# Patient Record
Sex: Male | Born: 1955 | Race: White | Hispanic: No | Marital: Married | Smoking: Never smoker
Health system: Southern US, Community
[De-identification: ages and names within clinical notes are randomized; demographics above are authoritative.]

## PROBLEM LIST (undated history)

## (undated) DIAGNOSIS — E785 Hyperlipidemia, unspecified: Secondary | ICD-10-CM

## (undated) DIAGNOSIS — Z8711 Personal history of peptic ulcer disease: Secondary | ICD-10-CM

## (undated) DIAGNOSIS — E119 Type 2 diabetes mellitus without complications: Secondary | ICD-10-CM

## (undated) DIAGNOSIS — Z6827 Body mass index (BMI) 27.0-27.9, adult: Secondary | ICD-10-CM

## (undated) DIAGNOSIS — K284 Chronic or unspecified gastrojejunal ulcer with hemorrhage: Secondary | ICD-10-CM

## (undated) DIAGNOSIS — H9190 Unspecified hearing loss, unspecified ear: Secondary | ICD-10-CM

## (undated) DIAGNOSIS — Z87442 Personal history of urinary calculi: Secondary | ICD-10-CM

## (undated) DIAGNOSIS — N183 Chronic kidney disease, stage 3 unspecified: Secondary | ICD-10-CM

## (undated) DIAGNOSIS — N200 Calculus of kidney: Secondary | ICD-10-CM

## (undated) DIAGNOSIS — E1122 Type 2 diabetes mellitus with diabetic chronic kidney disease: Secondary | ICD-10-CM

## (undated) DIAGNOSIS — C4490 Unspecified malignant neoplasm of skin, unspecified: Secondary | ICD-10-CM

## (undated) DIAGNOSIS — I129 Hypertensive chronic kidney disease with stage 1 through stage 4 chronic kidney disease, or unspecified chronic kidney disease: Secondary | ICD-10-CM

## (undated) DIAGNOSIS — K259 Gastric ulcer, unspecified as acute or chronic, without hemorrhage or perforation: Secondary | ICD-10-CM

## (undated) DIAGNOSIS — K274 Chronic or unspecified peptic ulcer, site unspecified, with hemorrhage: Secondary | ICD-10-CM

## (undated) DIAGNOSIS — I10 Essential (primary) hypertension: Secondary | ICD-10-CM

## (undated) HISTORY — DX: Chronic or unspecified gastrojejunal ulcer with hemorrhage: K28.4

## (undated) HISTORY — DX: Essential (primary) hypertension: I10

## (undated) HISTORY — PX: EYE SURGERY: SHX253

## (undated) HISTORY — DX: Hyperlipidemia, unspecified: E78.5

## (undated) HISTORY — DX: Type 2 diabetes mellitus with diabetic chronic kidney disease: E11.22

## (undated) HISTORY — DX: Type 2 diabetes mellitus without complications: E11.9

## (undated) HISTORY — DX: Unspecified malignant neoplasm of skin, unspecified: C44.90

## (undated) HISTORY — PX: OTHER SURGICAL HISTORY: SHX169

## (undated) HISTORY — DX: Unspecified hearing loss, unspecified ear: H91.90

## (undated) HISTORY — DX: Chronic kidney disease, stage 3 unspecified: N18.30

## (undated) HISTORY — DX: Calculus of kidney: N20.0

## (undated) HISTORY — DX: Chronic or unspecified peptic ulcer, site unspecified, with hemorrhage: K27.4

## (undated) HISTORY — DX: Gastric ulcer, unspecified as acute or chronic, without hemorrhage or perforation: K25.9

## (undated) HISTORY — DX: Body mass index (BMI) 27.0-27.9, adult: Z68.27

## (undated) HISTORY — DX: Type 2 diabetes mellitus with diabetic chronic kidney disease: I12.9

---
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
## 2005-04-12 ENCOUNTER — Ambulatory Visit: Admission: RE | Admit: 2005-04-12 | Discharge: 2005-04-12 | Payer: Self-pay | Admitting: Ophthalmology

## 2005-05-18 ENCOUNTER — Ambulatory Visit (HOSPITAL_COMMUNITY): Admission: RE | Admit: 2005-05-18 | Discharge: 2005-05-18 | Payer: Self-pay | Admitting: Ophthalmology

## 2006-08-02 HISTORY — PX: CATARACT EXTRACTION: SUR2

## 2015-02-19 HISTORY — PX: COLONOSCOPY: SHX174

## 2015-04-22 DIAGNOSIS — R5382 Chronic fatigue, unspecified: Secondary | ICD-10-CM | POA: Insufficient documentation

## 2015-04-22 DIAGNOSIS — R0602 Shortness of breath: Secondary | ICD-10-CM | POA: Insufficient documentation

## 2015-04-22 DIAGNOSIS — I10 Essential (primary) hypertension: Secondary | ICD-10-CM

## 2015-04-22 DIAGNOSIS — E119 Type 2 diabetes mellitus without complications: Secondary | ICD-10-CM | POA: Insufficient documentation

## 2015-04-22 HISTORY — DX: Shortness of breath: R06.02

## 2015-04-22 HISTORY — DX: Type 2 diabetes mellitus without complications: E11.9

## 2015-04-22 HISTORY — DX: Chronic fatigue, unspecified: R53.82

## 2015-04-22 HISTORY — DX: Essential (primary) hypertension: I10

## 2015-04-30 HISTORY — PX: ESOPHAGOGASTRODUODENOSCOPY: SHX1529

## 2016-08-02 HISTORY — PX: CATARACT EXTRACTION: SUR2

## 2018-10-09 DIAGNOSIS — D696 Thrombocytopenia, unspecified: Secondary | ICD-10-CM | POA: Diagnosis not present

## 2019-07-20 ENCOUNTER — Other Ambulatory Visit: Payer: Self-pay

## 2019-07-20 ENCOUNTER — Ambulatory Visit (INDEPENDENT_AMBULATORY_CARE_PROVIDER_SITE_OTHER): Payer: Medicare Other | Admitting: Cardiology

## 2019-07-20 ENCOUNTER — Encounter: Payer: Self-pay | Admitting: Cardiology

## 2019-07-20 DIAGNOSIS — R9431 Abnormal electrocardiogram [ECG] [EKG]: Secondary | ICD-10-CM

## 2019-07-20 DIAGNOSIS — E782 Mixed hyperlipidemia: Secondary | ICD-10-CM

## 2019-07-20 DIAGNOSIS — E088 Diabetes mellitus due to underlying condition with unspecified complications: Secondary | ICD-10-CM

## 2019-07-20 HISTORY — DX: Abnormal electrocardiogram (ECG) (EKG): R94.31

## 2019-07-20 HISTORY — DX: Mixed hyperlipidemia: E78.2

## 2019-07-20 HISTORY — DX: Diabetes mellitus due to underlying condition with unspecified complications: E08.8

## 2019-07-20 NOTE — Patient Instructions (Signed)
Medication Instructions:  Your physician recommends that you continue on your current medications as directed. Please refer to the Current Medication list given to you today.  *If you need a refill on your cardiac medications before your next appointment, please call your pharmacy*  Lab Work: None Ordered   Testing/Procedures: Your physician has requested that you have cardiac CT. Cardiac computed tomography (CT) is a painless test that uses an x-ray machine to take clear, detailed pictures of your heart. For further information please visit [email or web address]. Please follow instruction sheet as given.    Follow-Up: At [facility], you and your health needs are our priority.  As part of our continuing mission to provide you with exceptional heart care, we have created designated Provider Care Teams.  These Care Teams include your primary Cardiologist (physician) and Advanced Practice Providers (APPs -  Physician Assistants and Nurse Practitioners) who all work together to provide you with the care you need, when you need it.  Your next appointment:   2 month(s)  The format for your next appointment:   In Person  Provider:   Jyl Heinz, MD  Other Instructions  Echocardiogram An echocardiogram is a procedure that uses painless sound waves (ultrasound) to produce an image of the heart. Images from an echocardiogram can provide important information about:  Signs of coronary artery disease (CAD).  Aneurysm detection. An aneurysm is a weak or damaged part of an artery wall that bulges out from the normal force of blood pumping through the body.  Heart size and shape. Changes in the size or shape of the heart can be associated with certain conditions, including heart failure, aneurysm, and CAD.  Heart muscle function.  Heart valve function.  Signs of a past heart attack.  Fluid buildup around the heart.  Thickening of the heart muscle.  A tumor or infectious growth  around the heart valves. Tell a health care provider about:  Any allergies you have.  All medicines you are taking, including vitamins, herbs, eye drops, creams, and over-the-counter medicines.  Any blood disorders you have.  Any surgeries you have had.  Any medical conditions you have.  Whether you are pregnant or may be pregnant. What are the risks? Generally, this is a safe procedure. However, problems may occur, including:  Allergic reaction to dye (contrast) that may be used during the procedure. What happens before the procedure? No specific preparation is needed. You may eat and drink normally. What happens during the procedure?   An IV tube may be inserted into one of your veins.  You may receive contrast through this tube. A contrast is an injection that improves the quality of the pictures from your heart.  A gel will be applied to your chest.  A wand-like tool (transducer) will be moved over your chest. The gel will help to transmit the sound waves from the transducer.  The sound waves will harmlessly bounce off of your heart to allow the heart images to be captured in real-time motion. The images will be recorded on a computer. The procedure may vary among health care providers and hospitals. What happens after the procedure?  You may return to your normal, everyday life, including diet, activities, and medicines, unless your health care provider tells you not to do that. Summary  An echocardiogram is a procedure that uses painless sound waves (ultrasound) to produce an image of the heart.  Images from an echocardiogram can provide important information about the size and shape of your  heart, heart muscle function, heart valve function, and fluid buildup around your heart.  You do not need to do anything to prepare before this procedure. You may eat and drink normally.  After the echocardiogram is completed, you may return to your normal, everyday life, unless  your health care provider tells you not to do that. This information is not intended to replace advice given to you by your health care provider. Make sure you discuss any questions you have with your health care provider. Document Released: 07/16/2000 Document Revised: 11/09/2018 Document Reviewed: 08/21/2016 Elsevier Patient Education  [employer].   Coronary Calcium Scan A coronary calcium scan is an imaging test used to look for deposits of calcium and other fatty materials (plaques) in the inner lining of the blood vessels of the heart (coronary arteries). These deposits of calcium and plaques can partly clog and narrow the coronary arteries without producing any symptoms or warning signs. This puts a person at risk for a heart attack. This test can detect these deposits before symptoms develop. Tell a health care provider about:  Any allergies you have.  All medicines you are taking, including vitamins, herbs, eye drops, creams, and over-the-counter medicines.  Any problems you or family members have had with anesthetic medicines.  Any blood disorders you have.  Any surgeries you have had.  Any medical conditions you have.  Whether you are pregnant or may be pregnant. What are the risks? Generally, this is a safe procedure. However, problems may occur, including:  Harm to a pregnant woman and her unborn baby. This test involves the use of radiation. Radiation exposure can be dangerous to a pregnant woman and her unborn baby. If you are pregnant, you generally should not have this procedure done.  Slight increase in the risk of cancer. This is because of the radiation involved in the test. What happens before the procedure? No preparation is needed for this procedure. What happens during the procedure?   You will undress and remove any jewelry around your neck or chest.  You will put on a hospital gown.  Sticky electrodes will be placed on your chest. The electrodes  will be connected to an electrocardiogram (ECG) machine to record a tracing of the electrical activity of your heart.  A CT scanner will take pictures of your heart. During this time, you will be asked to lie still and hold your breath for 2-3 seconds while a picture of your heart is being taken. The procedure may vary among health care providers and hospitals. What happens after the procedure?  You can get dressed.  You can return to your normal activities.  It is up to you to get the results of your test. Ask your health care provider, or the department that is doing the test, when your results will be ready. Summary  A coronary calcium scan is an imaging test used to look for deposits of calcium and other fatty materials (plaques) in the inner lining of the blood vessels of the heart (coronary arteries).  Generally, this is a safe procedure. Tell your health care provider if you are pregnant or may be pregnant.  No preparation is needed for this procedure.  A CT scanner will take pictures of your heart.  You can return to your normal activities after the scan is done. This information is not intended to replace advice given to you by your health care provider. Make sure you discuss any questions you have with your health  care provider. Document Released: 01/15/2008 Document Revised: 07/01/2017 Document Reviewed: 06/07/2016 Elsevier Patient Education  2020 [employer].

## 2019-07-20 NOTE — Progress Notes (Signed)
Cardiology Office Note:    Date:  07/20/2019   ID:  MARTINJR Wu, DOB Dec 10, 1955, MRN [id number]  PCP:  Renaldo Reel, PA  Cardiologist:  Jenean Lindau, MD   Referring MD: Renaldo Reel, PA    ASSESSMENT:    1. Nonspecific abnormal electrocardiogram (ECG) (EKG)   2. Mixed dyslipidemia   3. Diabetes mellitus due to underlying condition with unspecified complications (David Wu)    PLAN:    In order of problems listed above:  1. Primary prevention stressed with the patient.  Importance of compliance with diet and medication stressed and he vocalized understanding.  Importance of regular exercise stressed walking at least 30 minutes a day 5 days a week.  He promises to do so.  Weight reduction was stressed. 2. Essential hypertension: Blood pressure stable.  Echocardiogram will be done to assess murmur heard on auscultation. 3. Mixed dyslipidemia: Diet was discussed patient is on statin therapy. 4. Diabetes mellitus: Diet was discussed.  He understands and promises to comply and do better. 5. For his stratification I discussed calcium CT scoring and is agreeable. 6. Patient will be seen in follow-up appointment in 2 months or earlier if the patient has any concerns    Medication Adjustments/Labs and Tests Ordered: Current medicines are reviewed at length with the patient today.  Concerns regarding medicines are outlined above.  No orders of the defined types were placed in this encounter.  No orders of the defined types were placed in this encounter.    History of Present Illness:    David Wu is a 63 y.o. male who is being seen today for the evaluation of abnormal EKG at the request of Renaldo Reel, [place].  Patient is a pleasant 63 year old male.  He has past medical history of essential hypertension, dyslipidemia and diabetes mellitus.  He is an active gentleman.  He walks some on a regular basis but does not exercise as prescribed.  He denies any chest pain  orthopnea or PND.  He is referred here for abnormal EKG.  At the time of my evaluation, the patient is alert awake oriented and in no distress.  Past Medical History:  Diagnosis Date  . Bleeding ulcer   . Diabetes ([place])   . Hyperlipidemia   . Hypertension   . Kidney stones     Past Surgical History:  Procedure Laterality Date  . EYE SURGERY Left     Current Medications: Current Meds  Medication Sig  . amLODipine-benazepril (LOTREL) 10-40 MG capsule Take by mouth. 1 tab daily  . atorvastatin (LIPITOR) 40 MG tablet Take by mouth. Take 1 tab daily  . cloNIDine (CATAPRES) 0.1 MG tablet Take by mouth. 1 tab twice daily  . Continuous Blood Gluc Sensor (FREESTYLE LIBRE 14 DAY SENSOR) MISC See admin instructions.  . Cyanocobalamin 2000 MCG TBCR Take by mouth.  . ergocalciferol (VITAMIN D2) 1.25 MG (50000 UT) capsule Take by mouth. weekly  . glimepiride (AMARYL) 2 MG tablet Take 2 mg by mouth 2 (two) times daily.  . hydrochlorothiazide (HYDRODIURIL) 25 MG tablet Take by mouth. Take 1 tab daily  . labetalol (NORMODYNE) 200 MG tablet Take by mouth. Take 1 tab twice daily  . Magnesium 200 MG TABS Take by mouth. 100 mg daily  . metFORMIN (GLUCOPHAGE) 500 MG tablet Take by mouth. Take 1 tab twice dialy  . omeprazole (PRILOSEC) 40 MG capsule Take by mouth. Take 1 tab daily  . pioglitazone (ACTOS) 45 MG tablet Take  45 mg by mouth daily.  . potassium chloride SA (KLOR-CON) 20 MEQ tablet Take by mouth. Take 1 tab daily  . tamsulosin (FLOMAX) 0.4 MG CAPS capsule Take by mouth. Take 1 tab daily     Allergies:   Patient has no allergy information on record.   Social History   Socioeconomic History  . Marital status: Single    Spouse name: Not on file  . Number of children: Not on file  . Years of education: Not on file  . Highest education level: Not on file  Occupational History  . Not on file  Tobacco Use  . Smoking status: Never Smoker  . Smokeless tobacco: Never Used  Substance and  Sexual Activity  . Alcohol use: Not Currently  . Drug use: Never  . Sexual activity: Not on file  Other Topics Concern  . Not on file  Social History Narrative  . Not on file   Social Determinants of Health   Financial Resource Strain:   . Difficulty of Paying Living Expenses: Not on file  Food Insecurity:   . Worried About Charity fundraiser in the Last Year: Not on file  . Ran Out of Food in the Last Year: Not on file  Transportation Needs:   . Lack of Transportation (Medical): Not on file  . Lack of Transportation (Non-Medical): Not on file  Physical Activity:   . Days of Exercise per Week: Not on file  . Minutes of Exercise per Session: Not on file  Stress:   . Feeling of Stress : Not on file  Social Connections:   . Frequency of Communication with Friends and Family: Not on file  . Frequency of Social Gatherings with Friends and Family: Not on file  . Attends Religious Services: Not on file  . Active Member of Clubs or Organizations: Not on file  . Attends Archivist Meetings: Not on file  . Marital Status: Not on file     Family History: The patient's family history includes Alzheimer's disease in his mother; Celiac disease in his brother; Diabetes in his father; Hypertension in his father; Stroke in his father.  ROS:   Please see the history of present illness.    All other systems reviewed and are negative.  EKGs/Labs/Other Studies Reviewed:    The following studies were reviewed today: EKG reveals sinus rhythm with nonspecific ST-T changes.  I do not have records available from primary care physician and will try to get a copy.   Recent Labs: No results found for requested labs within last 8760 hours.  Recent Lipid Panel No results found for: CHOL, TRIG, HDL, CHOLHDL, VLDL, LDLCALC, LDLDIRECT  Physical Exam:    VS:  BP (!) 160/72   Pulse 69   Ht 6' (1.829 m)   Wt 216 lb 3.2 oz (98.1 kg)   BMI 29.32 kg/m     Wt Readings from Last 3  Encounters:  07/20/19 216 lb 3.2 oz (98.1 kg)     GEN: Patient is in no acute distress HEENT: Normal NECK: No JVD; No carotid bruits LYMPHATICS: No lymphadenopathy CARDIAC: S1 S2 regular, 2/6 systolic murmur at the apex. RESPIRATORY:  Clear to auscultation without rales, wheezing or rhonchi  ABDOMEN: Soft, non-tender, non-distended MUSCULOSKELETAL:  No edema; No deformity  SKIN: Warm and dry NEUROLOGIC:  Alert and oriented x 3 PSYCHIATRIC:  Normal affect    Signed, Jenean Lindau, MD  07/20/2019 4:52 PM    [facility] [facility]

## 2019-07-20 NOTE — Progress Notes (Signed)
kg

## 2019-08-15 ENCOUNTER — Ambulatory Visit (INDEPENDENT_AMBULATORY_CARE_PROVIDER_SITE_OTHER)
Admission: RE | Admit: 2019-08-15 | Discharge: 2019-08-15 | Disposition: A | Discharge status: Home / Self Care | Payer: Self-pay | Source: Ambulatory Visit | Attending: Cardiology | Admitting: Cardiology

## 2019-08-15 ENCOUNTER — Other Ambulatory Visit: Payer: Self-pay

## 2019-08-15 DIAGNOSIS — R9431 Abnormal electrocardiogram [ECG] [EKG]: Secondary | ICD-10-CM

## 2019-08-20 ENCOUNTER — Telehealth: Payer: Self-pay

## 2019-08-20 NOTE — Telephone Encounter (Signed)
Patient is currently taking atorvastatin 40 mg (1 tablet) once daily as directed. Most recent labs requested from Dr. Lorin Mercy to verify lipid/hepatic results.

## 2019-08-20 NOTE — Telephone Encounter (Signed)
Reviewed. Keep meds same.no changes

## 2019-08-20 NOTE — Telephone Encounter (Signed)
-----   Message from Jenean Lindau, MD sent at 08/15/2019 12:18 PM EST ----- Calcium score is very elevated.  Please patient needs to come in for liver lipid check and initiate on statin therapy.  Cc primary care. Jenean Lindau, MD 08/15/2019 12:17 PM

## 2019-09-03 ENCOUNTER — Ambulatory Visit (INDEPENDENT_AMBULATORY_CARE_PROVIDER_SITE_OTHER): Payer: Medicare Other

## 2019-09-03 ENCOUNTER — Other Ambulatory Visit: Payer: Self-pay

## 2019-09-03 DIAGNOSIS — E782 Mixed hyperlipidemia: Secondary | ICD-10-CM

## 2019-09-03 DIAGNOSIS — R9431 Abnormal electrocardiogram [ECG] [EKG]: Secondary | ICD-10-CM

## 2019-09-03 DIAGNOSIS — E088 Diabetes mellitus due to underlying condition with unspecified complications: Secondary | ICD-10-CM

## 2019-09-03 NOTE — Progress Notes (Signed)
Complete echocardiogram has been performed.  Jimmy Areta Terwilliger RDCS, RVT 

## 2019-09-25 ENCOUNTER — Ambulatory Visit (INDEPENDENT_AMBULATORY_CARE_PROVIDER_SITE_OTHER): Payer: Medicare Other | Admitting: Cardiology

## 2019-09-25 ENCOUNTER — Encounter: Payer: Self-pay | Admitting: Cardiology

## 2019-09-25 ENCOUNTER — Other Ambulatory Visit: Payer: Self-pay

## 2019-09-25 VITALS — BP 158/82 | HR 77 | Ht 72.0 in | Wt 216.0 lb

## 2019-09-25 DIAGNOSIS — Z1329 Encounter for screening for other suspected endocrine disorder: Secondary | ICD-10-CM

## 2019-09-25 DIAGNOSIS — R0602 Shortness of breath: Secondary | ICD-10-CM | POA: Diagnosis not present

## 2019-09-25 DIAGNOSIS — E782 Mixed hyperlipidemia: Secondary | ICD-10-CM

## 2019-09-25 DIAGNOSIS — E088 Diabetes mellitus due to underlying condition with unspecified complications: Secondary | ICD-10-CM

## 2019-09-25 DIAGNOSIS — I10 Essential (primary) hypertension: Secondary | ICD-10-CM | POA: Diagnosis not present

## 2019-09-25 NOTE — Progress Notes (Signed)
Cardiology Office Note:    Date:  09/25/2019   ID:  David Wu, DOB 04/02/1956, MRN [id number]  PCP:  Renaldo Reel, PA  Cardiologist:  Jenean Lindau, MD   Referring MD: Renaldo Reel, PA    ASSESSMENT:    1. Shortness of breath    PLAN:    In order of problems listed above:  1. Elevated calcium score: Patient has multiple risk factors for coronary artery disease and gives history of some dyspnea on exertion therefore we will do a Lexiscan sestamibi to assess his symptoms.  I discussed this with him at extensive length and risk factor modification was advised. 2. Essential hypertension: Blood pressure stable at home.  He has an element of whitecoat hypertension and he mentioned his blood pressures and they are fine 3. Mixed dyslipidemia and diabetes mellitus: He will be back in the next few days for blood work.  This will be fasting lipids also.  Diet was emphasized.  Weight reduction stressed and he promises to do better. 4. Patient will be seen in follow-up appointment in 3 months or earlier if the patient has any concerns    Medication Adjustments/Labs and Tests Ordered: Current medicines are reviewed at length with the patient today.  Concerns regarding medicines are outlined above.  Orders Placed This Encounter  Procedures  . ECHOCARDIOGRAM COMPLETE   No orders of the defined types were placed in this encounter.    Chief Complaint  Patient presents with  . Follow-up    2 Months     History of Present Illness:    David Wu is a 64 y.o. male.  Patient has history of essential hypertension and dyslipidemia and diabetes mellitus.  His calcium score is extremely high.  Overall he leaves active lifestyle.  No chest pain orthopnea or PND.  He gives some history of dyspnea on exertion.  At the time of my evaluation, the patient is alert awake oriented and in no distress.  Past Medical History:  Diagnosis Date  . Bleeding ulcer   . Diabetes ([place])     . Hyperlipidemia   . Hypertension   . Kidney stones     Past Surgical History:  Procedure Laterality Date  . EYE SURGERY Left     Current Medications: Current Meds  Medication Sig  . amLODipine-benazepril (LOTREL) 10-40 MG capsule Take by mouth. 1 tab daily  . atorvastatin (LIPITOR) 40 MG tablet Take by mouth. Take 1 tab daily  . cloNIDine (CATAPRES) 0.1 MG tablet Take by mouth. 1 tab twice daily  . Continuous Blood Gluc Sensor (FREESTYLE LIBRE 14 DAY SENSOR) MISC See admin instructions.  . Cyanocobalamin 2000 MCG TBCR Take by mouth.  . ergocalciferol (VITAMIN D2) 1.25 MG (50000 UT) capsule Take by mouth. weekly  . glimepiride (AMARYL) 2 MG tablet Take 2 mg by mouth 2 (two) times daily.  . hydrochlorothiazide (HYDRODIURIL) 25 MG tablet Take by mouth. Take 1 tab daily  . labetalol (NORMODYNE) 200 MG tablet Take by mouth. Take 1 tab twice daily  . Magnesium 200 MG TABS Take by mouth. 100 mg daily  . metFORMIN (GLUCOPHAGE) 500 MG tablet Take by mouth. Take 1 tab twice dialy  . omeprazole (PRILOSEC) 40 MG capsule Take by mouth. Take 1 tab daily  . pioglitazone (ACTOS) 45 MG tablet Take 45 mg by mouth daily.  . potassium chloride SA (KLOR-CON) 20 MEQ tablet Take by mouth. Take 1 tab daily  . tamsulosin (FLOMAX) 0.4 MG CAPS  capsule Take by mouth. Take 1 tab daily     Allergies:   Patient has no allergy information on record.   Social History   Socioeconomic History  . Marital status: Single    Spouse name: Not on file  . Number of children: Not on file  . Years of education: Not on file  . Highest education level: Not on file  Occupational History  . Not on file  Tobacco Use  . Smoking status: Never Smoker  . Smokeless tobacco: Never Used  Substance and Sexual Activity  . Alcohol use: Not Currently  . Drug use: Never  . Sexual activity: Not on file  Other Topics Concern  . Not on file  Social History Narrative  . Not on file   Social Determinants of Health    Financial Resource Strain:   . Difficulty of Paying Living Expenses: Not on file  Food Insecurity:   . Worried About Charity fundraiser in the Last Year: Not on file  . Ran Out of Food in the Last Year: Not on file  Transportation Needs:   . Lack of Transportation (Medical): Not on file  . Lack of Transportation (Non-Medical): Not on file  Physical Activity:   . Days of Exercise per Week: Not on file  . Minutes of Exercise per Session: Not on file  Stress:   . Feeling of Stress : Not on file  Social Connections:   . Frequency of Communication with Friends and Family: Not on file  . Frequency of Social Gatherings with Friends and Family: Not on file  . Attends Religious Services: Not on file  . Active Member of Clubs or Organizations: Not on file  . Attends Archivist Meetings: Not on file  . Marital Status: Not on file     Family History: The patient's family history includes Alzheimer's disease in his mother; Celiac disease in his brother; Diabetes in his father; Hypertension in his father; Stroke in his father.  ROS:   Please see the history of present illness.    All other systems reviewed and are negative.  EKGs/Labs/Other Studies Reviewed:    The following studies were reviewed today: IMPRESSION: Coronary calcium score of 966. This was 91st percentile for age and sex matched control.  Recommend aggressive risk factor modification.  Consider noninvasive testing to assess for silent ischemia.  Fransico Him   Electronically Signed   By: Fransico Him   On: 08/15/2019 10:00   Recent Labs: No results found for requested labs within last 8760 hours.  Recent Lipid Panel No results found for: CHOL, TRIG, HDL, CHOLHDL, VLDL, LDLCALC, LDLDIRECT  Physical Exam:    VS:  BP (!) 158/82   Pulse 77   Ht 6' (1.829 m)   Wt 216 lb (98 kg)   SpO2 96%   BMI 29.29 kg/m     Wt Readings from Last 3 Encounters:  09/25/19 216 lb (98 kg)  07/20/19 216 lb  3.2 oz (98.1 kg)     GEN: Patient is in no acute distress HEENT: Normal NECK: No JVD; No carotid bruits LYMPHATICS: No lymphadenopathy CARDIAC: Hear sounds regular, 2/6 systolic murmur at the apex. RESPIRATORY:  Clear to auscultation without rales, wheezing or rhonchi  ABDOMEN: Soft, non-tender, non-distended MUSCULOSKELETAL:  No edema; No deformity  SKIN: Warm and dry NEUROLOGIC:  Alert and oriented x 3 PSYCHIATRIC:  Normal affect   Signed, Jenean Lindau, MD  09/25/2019 3:40 PM    [facility]

## 2019-09-25 NOTE — Patient Instructions (Addendum)
Medication Instructions:  No medication changes *If you need a refill on your cardiac medications before your next appointment, please call your pharmacy*  Lab Work: You need to have labs done when you are fasting.  You can come Monday through Friday 8:30 am to 12:00 pm and 1:15 to 4:30. You do not need to make an appointment as the order has already been placed. The labs you are going to have done are BMET, CBC, TSH, LFT and Lipids. If you have labs (blood work) drawn today and your tests are completely normal, you will receive your results only by: Marland Kitchen MyChart Message (if you have MyChart) OR . A paper copy in the mail If you have any lab test that is abnormal or we need to change your treatment, we will call you to review the results.  Testing/Procedures: Your physician has requested that you have a lexiscan myoview. For further information please visit [email or web address]. Please follow instruction sheet, as given.    Follow-Up: At [facility], you and your health needs are our priority.  As part of our continuing mission to provide you with exceptional heart care, we have created designated Provider Care Teams.  These Care Teams include your primary Cardiologist (physician) and Advanced Practice Providers (APPs -  Physician Assistants and Nurse Practitioners) who all work together to provide you with the care you need, when you need it.  Your next appointment:   3 month(s)  The format for your next appointment:   In Person  Provider:   Jyl Heinz, MD  Other Instructions

## 2019-09-25 NOTE — Addendum Note (Signed)
Addended by: Truddie Hidden on: 09/25/2019 04:07 PM   Modules accepted: Orders

## 2019-09-27 ENCOUNTER — Other Ambulatory Visit: Payer: Self-pay

## 2019-09-27 DIAGNOSIS — E119 Type 2 diabetes mellitus without complications: Secondary | ICD-10-CM

## 2019-09-27 DIAGNOSIS — E782 Mixed hyperlipidemia: Secondary | ICD-10-CM

## 2019-09-27 DIAGNOSIS — Z794 Long term (current) use of insulin: Secondary | ICD-10-CM

## 2019-09-27 DIAGNOSIS — I10 Essential (primary) hypertension: Secondary | ICD-10-CM

## 2019-09-27 LAB — CBC WITH DIFFERENTIAL/PLATELET
Basophils Absolute: 0 10*3/uL (ref 0.0–0.2)
Basos: 1 %
EOS (ABSOLUTE): 0.2 10*3/uL (ref 0.0–0.4)
Eos: 4 %
Hematocrit: 38 % (ref 37.5–51.0)
Hemoglobin: 13.3 g/dL (ref 13.0–17.7)
Immature Grans (Abs): 0 10*3/uL (ref 0.0–0.1)
Immature Granulocytes: 0 %
Lymphocytes Absolute: 1.2 10*3/uL (ref 0.7–3.1)
Lymphs: 29 %
MCH: 31.3 pg (ref 26.6–33.0)
MCHC: 35 g/dL (ref 31.5–35.7)
MCV: 89 fL (ref 79–97)
Monocytes Absolute: 0.5 10*3/uL (ref 0.1–0.9)
Monocytes: 12 %
Neutrophils Absolute: 2.4 10*3/uL (ref 1.4–7.0)
Neutrophils: 54 %
Platelets: 121 10*3/uL — ABNORMAL LOW (ref 150–450)
RBC: 4.25 x10E6/uL (ref 4.14–5.80)
RDW: 13.8 % (ref 11.6–15.4)
WBC: 4.3 10*3/uL (ref 3.4–10.8)

## 2019-09-27 LAB — LIPID PANEL
Chol/HDL Ratio: 3.6 ratio (ref 0.0–5.0)
Cholesterol, Total: 142 mg/dL (ref 100–199)
HDL: 39 mg/dL — ABNORMAL LOW (ref >39)
LDL Chol Calc (NIH): 67 mg/dL (ref 0–99)
Triglycerides: 217 mg/dL — ABNORMAL HIGH (ref 0–149)
VLDL Cholesterol Cal: 36 mg/dL (ref 5–40)

## 2019-09-27 LAB — BASIC METABOLIC PANEL
BUN/Creatinine Ratio: 13 (ref 10–24)
BUN: 15 mg/dL (ref 8–27)
CO2: 23 mmol/L (ref 20–29)
Calcium: 9.5 mg/dL (ref 8.6–10.2)
Chloride: 96 mmol/L (ref 96–106)
Creatinine, Ser: 1.13 mg/dL (ref 0.76–1.27)
GFR calc Af Amer: 78 mL/min/{1.73_m2} (ref >59)
GFR calc non Af Amer: 68 mL/min/{1.73_m2} (ref >59)
Glucose: 120 mg/dL — ABNORMAL HIGH (ref 65–99)
Potassium: 3.2 mmol/L — ABNORMAL LOW (ref 3.5–5.2)
Sodium: 136 mmol/L (ref 134–144)

## 2019-09-27 LAB — HEPATIC FUNCTION PANEL
ALT: 19 IU/L (ref 0–44)
AST: 19 IU/L (ref 0–40)
Albumin: 4.3 g/dL (ref 3.8–4.8)
Alkaline Phosphatase: 64 IU/L (ref 39–117)
Bilirubin Total: 0.5 mg/dL (ref 0.0–1.2)
Bilirubin, Direct: 0.12 mg/dL (ref 0.00–0.40)
Total Protein: 6.2 g/dL (ref 6.0–8.5)

## 2019-09-27 LAB — TSH: TSH: 3.44 u[IU]/mL (ref 0.450–4.500)

## 2019-09-27 NOTE — Progress Notes (Unsigned)
Results reviewed with pt as per Dr. Julien Nordmann note.  Pt verbalized understanding and had no additional questions. Labs placed in the computer for the future.

## 2019-10-05 ENCOUNTER — Other Ambulatory Visit: Payer: Self-pay

## 2019-10-05 DIAGNOSIS — E876 Hypokalemia: Secondary | ICD-10-CM

## 2019-10-05 LAB — HEPATIC FUNCTION PANEL
ALT: 19 IU/L (ref 0–44)
AST: 20 IU/L (ref 0–40)
Albumin: 4 g/dL (ref 3.8–4.8)
Alkaline Phosphatase: 65 IU/L (ref 39–117)
Bilirubin Total: 0.4 mg/dL (ref 0.0–1.2)
Bilirubin, Direct: 0.12 mg/dL (ref 0.00–0.40)
Total Protein: 6.2 g/dL (ref 6.0–8.5)

## 2019-10-05 LAB — BASIC METABOLIC PANEL WITH GFR
BUN/Creatinine Ratio: 15 (ref 10–24)
BUN: 16 mg/dL (ref 8–27)
CO2: 22 mmol/L (ref 20–29)
Calcium: 9.6 mg/dL (ref 8.6–10.2)
Chloride: 97 mmol/L (ref 96–106)
Creatinine, Ser: 1.08 mg/dL (ref 0.76–1.27)
GFR calc Af Amer: 83 mL/min/1.73
GFR calc non Af Amer: 72 mL/min/1.73
Glucose: 103 mg/dL — ABNORMAL HIGH (ref 65–99)
Potassium: 3.4 mmol/L — ABNORMAL LOW (ref 3.5–5.2)
Sodium: 136 mmol/L (ref 134–144)

## 2019-10-05 LAB — LIPID PANEL
Chol/HDL Ratio: 3.3 ratio (ref 0.0–5.0)
Cholesterol, Total: 126 mg/dL (ref 100–199)
HDL: 38 mg/dL — ABNORMAL LOW (ref >39)
LDL Chol Calc (NIH): 65 mg/dL (ref 0–99)
Triglycerides: 128 mg/dL (ref 0–149)
VLDL Cholesterol Cal: 23 mg/dL (ref 5–40)

## 2019-10-15 LAB — BASIC METABOLIC PANEL
BUN/Creatinine Ratio: 16 (ref 10–24)
BUN: 17 mg/dL (ref 8–27)
CO2: 23 mmol/L (ref 20–29)
Calcium: 9.6 mg/dL (ref 8.6–10.2)
Chloride: 100 mmol/L (ref 96–106)
Creatinine, Ser: 1.08 mg/dL (ref 0.76–1.27)
GFR calc Af Amer: 83 mL/min/{1.73_m2} (ref >59)
GFR calc non Af Amer: 72 mL/min/{1.73_m2} (ref >59)
Glucose: 126 mg/dL — ABNORMAL HIGH (ref 65–99)
Potassium: 3.9 mmol/L (ref 3.5–5.2)
Sodium: 137 mmol/L (ref 134–144)

## 2019-10-16 ENCOUNTER — Encounter: Payer: Self-pay | Admitting: *Deleted

## 2019-10-17 ENCOUNTER — Telehealth (HOSPITAL_COMMUNITY): Payer: Self-pay | Admitting: *Deleted

## 2019-10-17 NOTE — Telephone Encounter (Deleted)
Patient given detailed instructions per Myocardial Perfusion Study Information Sheet for the test on 10/24/19 at 8:15. Patient notified to arrive 15 minutes early and that it is imperative to arrive on time for appointment to keep from having the test rescheduled.  If you need to cancel or reschedule your appointment, please call the office within 24 hours of your appointment. . Patient verbalized understanding.David Wu

## 2019-10-17 NOTE — Telephone Encounter (Signed)
Left message on voicemail per DPR in reference to upcoming appointment scheduled on 10/24/19 at 8:15 with detailed instructions given per Myocardial Perfusion Study Information Sheet for the test. LM to arrive 15 minutes early, and that it is imperative to arrive on time for appointment to keep from having the test rescheduled. If you need to cancel or reschedule your appointment, please call the office within 24 hours of your appointment. Failure to do so may result in a cancellation of your appointment, and a $50 no show fee. Phone number given for call back for any questions.

## 2019-10-24 ENCOUNTER — Other Ambulatory Visit: Payer: Self-pay

## 2019-10-24 ENCOUNTER — Ambulatory Visit (INDEPENDENT_AMBULATORY_CARE_PROVIDER_SITE_OTHER): Payer: Medicare Other

## 2019-10-24 DIAGNOSIS — R0602 Shortness of breath: Secondary | ICD-10-CM | POA: Diagnosis not present

## 2019-10-24 LAB — MYOCARDIAL PERFUSION IMAGING
LV dias vol: 135 mL (ref 62–150)
LV sys vol: 58 mL
Peak HR: 100 {beats}/min
Rest HR: 59 {beats}/min
SDS: 0
SRS: 5
SSS: 5
TID: 1.12

## 2019-10-24 MED ORDER — TECHNETIUM TC 99M TETROFOSMIN IV KIT
9.8000 | PACK | Freq: Once | INTRAVENOUS | Status: AC | PRN
Start: 2019-10-24 — End: 2019-10-24
  Administered 2019-10-24: 9.8 via INTRAVENOUS

## 2019-10-24 MED ORDER — REGADENOSON 0.4 MG/5ML IV SOLN
0.4000 mg | Freq: Once | INTRAVENOUS | Status: AC
  Administered 2019-10-24: 0.4 mg via INTRAVENOUS

## 2019-10-24 MED ORDER — TECHNETIUM TC 99M TETROFOSMIN IV KIT
31.3000 | PACK | Freq: Once | INTRAVENOUS | Status: AC | PRN
  Administered 2019-10-24: 31.3 via INTRAVENOUS

## 2019-10-29 MED ORDER — NITROGLYCERIN 0.4 MG SL SUBL: 0.4000 mg | SUBLINGUAL_TABLET | SUBLINGUAL | 6 refills | Status: DC | PRN

## 2019-10-29 NOTE — Addendum Note (Signed)
Addended by: Truddie Hidden on: 10/29/2019 09:29 AM   Modules accepted: Orders

## 2019-11-20 ENCOUNTER — Encounter: Payer: Self-pay | Admitting: Cardiology

## 2019-11-20 ENCOUNTER — Ambulatory Visit (INDEPENDENT_AMBULATORY_CARE_PROVIDER_SITE_OTHER): Payer: Medicare Other | Admitting: Cardiology

## 2019-11-20 ENCOUNTER — Other Ambulatory Visit: Payer: Self-pay

## 2019-11-20 VITALS — BP 162/72 | HR 64 | Temp 97.6°F | Ht 72.0 in | Wt 209.6 lb

## 2019-11-20 DIAGNOSIS — E088 Diabetes mellitus due to underlying condition with unspecified complications: Secondary | ICD-10-CM | POA: Diagnosis not present

## 2019-11-20 DIAGNOSIS — R9439 Abnormal result of other cardiovascular function study: Secondary | ICD-10-CM | POA: Insufficient documentation

## 2019-11-20 DIAGNOSIS — R931 Abnormal findings on diagnostic imaging of heart and coronary circulation: Secondary | ICD-10-CM

## 2019-11-20 DIAGNOSIS — I10 Essential (primary) hypertension: Secondary | ICD-10-CM

## 2019-11-20 DIAGNOSIS — I2510 Atherosclerotic heart disease of native coronary artery without angina pectoris: Secondary | ICD-10-CM | POA: Insufficient documentation

## 2019-11-20 HISTORY — DX: Atherosclerotic heart disease of native coronary artery without angina pectoris: I25.10

## 2019-11-20 HISTORY — DX: Abnormal result of other cardiovascular function study: R94.39

## 2019-11-20 NOTE — Progress Notes (Signed)
Cardiology Office Note:    Date:  11/20/2019   ID:  David Wu, DOB 1955/09/17, MRN [id number]  PCP:  Renaldo Reel, PA  Cardiologist:  Jenean Lindau, MD   Referring MD: Renaldo Reel, PA    ASSESSMENT:    1. Essential hypertension   2. Coronary artery disease involving native coronary artery of native heart without angina pectoris   3. Diabetes mellitus due to underlying condition with unspecified complications ([place])   4. Abnormal nuclear stress test    PLAN:    In order of problems listed above:  1. Coronary artery disease: Secondary prevention stressed with the patient.  Importance of compliance with diet medication stressed and he vocalized understanding.  His effort tolerance is excellent. 2. Abnormal nuclear stress test and markedly elevated calcium score.  I discussed with the patient the above results.  I discussed invasive and noninvasive evaluation and he opts for CT coronary angiography.  We will set this up for him.  Sublingual nitroglycerin prescription was sent, its protocol and 911 protocol explained and the patient vocalized understanding questions were answered to the patient's satisfaction.  Patient has been advised to take aspirin on a daily basis.  I told him to take an over-the-counter proton pump inhibitor and let his medical doctor know about the fact that he has initiated aspirin.  I also told him to look out for black stools and such. 3. Essential hypertension: Blood pressure is stable 4. Mixed dyslipidemia: I reviewed lipids done recently and they were fine. 5. Patient will be seen in follow-up appointment in 6 weeks or earlier if the patient has any concerns 6. He knows to go to the nearest emergency room for any concerning symptoms.   Medication Adjustments/Labs and Tests Ordered: Current medicines are reviewed at length with the patient today.  Concerns regarding medicines are outlined above.  No orders of the defined types were placed in this  encounter.  No orders of the defined types were placed in this encounter.    No chief complaint on file.    History of Present Illness:    David Wu is a 64 y.o. male.  Patient has past medical history of essential hypertension dyslipidemia and diabetes mellitus.  His stress test is abnormal.  He has a markedly elevated calcium score.  The reports are mentioned in detail below.  The patient mentions to me that he walks about 20 minutes without any problems.  No chest pain orthopnea or PND.  He has had history of bleeding ulcer in the past.  He is currently fine.  At the time of my evaluation, the patient is alert awake oriented and in no distress.  He has started using aspirin on a regular basis without any problems.  Past Medical History:  Diagnosis Date  . Bleeding ulcer   . Chronic fatigue 04/22/2015  . Diabetes ([place])   . Diabetes mellitus due to underlying condition with unspecified complications ([place]) [id number]  . Essential hypertension 04/22/2015  . Hyperlipidemia   . Hypertension   . Kidney stones   . Mixed dyslipidemia 07/20/2019  . Nonspecific abnormal electrocardiogram (ECG) (EKG) 07/20/2019  . Shortness of breath 04/22/2015  . Type 2 diabetes mellitus without complication ([place]) [id number]    Past Surgical History:  Procedure Laterality Date  . EYE SURGERY Left     Current Medications: Current Meds  Medication Sig  . amLODipine-benazepril (LOTREL) 10-40 MG capsule Take by mouth. 1 tab daily  . atorvastatin (LIPITOR)  40 MG tablet Take by mouth. Take 1 tab daily  . cloNIDine (CATAPRES) 0.1 MG tablet Take by mouth. 1 tab twice daily  . Coenzyme Q10 (CO Q-10) 100 MG CHEW Chew 100 mg by mouth daily.  . Continuous Blood Gluc Sensor (FREESTYLE LIBRE 14 DAY SENSOR) MISC See admin instructions.  . Cyanocobalamin 2000 MCG TBCR Take by mouth.  . ergocalciferol (VITAMIN D2) 1.25 MG (50000 UT) capsule Take by mouth. weekly  . glimepiride (AMARYL) 2 MG tablet Take 2 mg  by mouth 2 (two) times daily.  . hydrochlorothiazide (HYDRODIURIL) 25 MG tablet Take by mouth. Take 1 tab daily  . labetalol (NORMODYNE) 200 MG tablet Take by mouth. Take 1 tab twice daily  . Magnesium 200 MG TABS Take by mouth. 100 mg daily  . metFORMIN (GLUCOPHAGE) 500 MG tablet Take by mouth. Take 1 tab twice dialy  . nitroGLYCERIN (NITROSTAT) 0.4 MG SL tablet Place 1 tablet (0.4 mg total) under the tongue every 5 (five) minutes as needed for chest pain.  Marland Kitchen omeprazole (PRILOSEC) 40 MG capsule Take by mouth. Take 1 tab daily  . pioglitazone (ACTOS) 45 MG tablet Take 45 mg by mouth daily.  . Potassium Chloride ER 20 MEQ TBCR Take 20 mg by mouth 2 (two) times daily.  . tamsulosin (FLOMAX) 0.4 MG CAPS capsule Take by mouth. Take 1 tab daily     Allergies:   Patient has no allergy information on record.   Social History   Socioeconomic History  . Marital status: Single    Spouse name: Not on file  . Number of children: Not on file  . Years of education: Not on file  . Highest education level: Not on file  Occupational History  . Not on file  Tobacco Use  . Smoking status: Never Smoker  . Smokeless tobacco: Never Used  Substance and Sexual Activity  . Alcohol use: Not Currently  . Drug use: Never  . Sexual activity: Not on file  Other Topics Concern  . Not on file  Social History Narrative  . Not on file   Social Determinants of Health   Financial Resource Strain:   . Difficulty of Paying Living Expenses:   Food Insecurity:   . Worried About Charity fundraiser in the Last Year:   . Arboriculturist in the Last Year:   Transportation Needs:   . Film/video editor (Medical):   Marland Kitchen Lack of Transportation (Non-Medical):   Physical Activity:   . Days of Exercise per Week:   . Minutes of Exercise per Session:   Stress:   . Feeling of Stress :   Social Connections:   . Frequency of Communication with Friends and Family:   . Frequency of Social Gatherings with Friends and  Family:   . Attends Religious Services:   . Active Member of Clubs or Organizations:   . Attends Archivist Meetings:   Marland Kitchen Marital Status:      Family History: The patient's family history includes Alzheimer's disease in his mother; Celiac disease in his brother; Diabetes in his father; Hypertension in his father; Stroke in his father.  ROS:   Please see the history of present illness.    All other systems reviewed and are negative.  EKGs/Labs/Other Studies Reviewed:    The following studies were reviewed today: Study Highlights   The left ventricular ejection fraction is normal (55-65%).  Nuclear stress EF: 57%.  There was no ST segment deviation noted  during stress.  No T wave inversion was noted during stress.  Defect 1: There is a medium defect of moderate severity present in the basal inferoseptal and basal inferior location with mild hypokinesis.  Findings consistent with prior myocardial infarction with peri-infarct ischemia.  This is an intermediate risk study.   IMPRESSIONS    1. Left ventricular ejection fraction, by estimation, is 60 to 65%. The  left ventricle has normal function. The left ventricle has no regional  wall motion abnormalities. 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 Left  ventricular diastolic parameters are indeterminate.  2. Right ventricular systolic function is normal. The right ventricular  size is normal.  3. The mitral valve is normal in structure. No evidence of mitral valve  regurgitation. No evidence of mitral stenosis.  4. The aortic valve is tricuspid. Aortic valve regurgitation is not  visualized. No aortic stenosis is present.   IMPRESSION: Coronary calcium score of 966. This was 91st percentile for age and sex matched control.  Recommend aggressive risk factor modification.  Consider noninvasive testing to assess for silent ischemia.  Fransico Him   Electronically  Signed   By: Fransico Him   On: 08/15/2019 10:00   Recent Labs: 09/26/2019: Hemoglobin 13.3; Platelets 121; TSH 3.440 10/04/2019: ALT 19 10/10/2019: BUN 17; Creatinine, Ser 1.08; Potassium 3.9; Sodium 137  Recent Lipid Panel    Component Value Date/Time   CHOL 126 10/04/2019 0943   TRIG 128 10/04/2019 0943   HDL 38 (L) 10/04/2019 0943   CHOLHDL 3.3 10/04/2019 0943   LDLCALC 65 10/04/2019 0943    Physical Exam:    VS:  BP (!) 162/72   Pulse 64   Temp 97.6 F (36.4 C)   Ht 6' (1.829 m)   Wt 209 lb 9.6 oz (95.1 kg)   SpO2 97%   BMI 28.43 kg/m     Wt Readings from Last 3 Encounters:  11/20/19 209 lb 9.6 oz (95.1 kg)  10/24/19 216 lb (98 kg)  09/25/19 216 lb (98 kg)     GEN: Patient is in no acute distress HEENT: Normal NECK: No JVD; No carotid bruits LYMPHATICS: No lymphadenopathy CARDIAC: Hear sounds regular, 2/6 systolic murmur at the apex. RESPIRATORY:  Clear to auscultation without rales, wheezing or rhonchi  ABDOMEN: Soft, non-tender, non-distended MUSCULOSKELETAL:  No edema; No deformity  SKIN: Warm and dry NEUROLOGIC:  Alert and oriented x 3 PSYCHIATRIC:  Normal affect   Signed, Jenean Lindau, MD  11/20/2019 9:24 AM    [place]

## 2019-11-20 NOTE — Patient Instructions (Signed)
Medication Instructions:  No medication changes *If you need a refill on your cardiac medications before your next appointment, please call your pharmacy*   Lab Work: Your physician recommends that you return for lab work in: 1 week prior to your CT.  If you have labs (blood work) drawn today and your tests are completely normal, you will receive your results only by: Marland Kitchen MyChart Message (if you have MyChart) OR . A paper copy in the mail If you have any lab test that is abnormal or we need to change your treatment, we will call you to review the results.   Testing/Procedures: Your cardiac CT will be scheduled at:   [facility] [address] [place], [place] [address] [phone]  At [facility], please arrive at the [facility] main entrance of [facility] 30 minutes prior to test start time. Proceed to the [facility] Radiology Department (first floor) to check-in and test prep.  Please follow these instructions carefully (unless otherwise directed):  Hold all erectile dysfunction medications at least 3 days (72 hrs) prior to test.  On the Night Before the Test: . Be sure to Drink plenty of water. . Do not consume any caffeinated/decaffeinated beverages or chocolate 12 hours prior to your test. . Do not take any antihistamines 12 hours prior to your test. . If you take Metformin do not take 24 hours prior to test.   On the Day of the Test: . Drink plenty of water. Do not drink any water within one hour of the test. . Do not eat any food 4 hours prior to the test. . You may take your regular medications prior to the test.  . Take your Labetalol two hours prior to test. . HOLD Hydrochlorothiazide morning of the test.       After the Test: . Drink plenty of water. . After receiving IV contrast, you may experience a mild flushed feeling. This is normal. . On occasion, you may experience a mild rash up to 24 hours after the test. This is not  dangerous. If this occurs, you can take Benadryl 25 mg and increase your fluid intake. . If you experience trouble breathing, this can be serious. If it is severe call 911 IMMEDIATELY. If it is mild, please call our office. . If you take any of these medications: Glipizide/Metformin, Avandament, Glucavance, please do not take 48 hours after completing test unless otherwise instructed.   Once we have confirmed authorization from your insurance company, we will call you to set up a date and time for your test.   For non-scheduling related questions, please contact the cardiac imaging nurse navigator should you have any questions/concerns: Marchia Bond, RN Navigator Cardiac Imaging [facility] [phone] office  For scheduling needs, including cancellations and rescheduling, please call [phone].      Follow-Up: At [facility], you and your health needs are our priority.  As part of our continuing mission to provide you with exceptional heart care, we have created designated Provider Care Teams.  These Care Teams include your primary Cardiologist (physician) and Advanced Practice Providers (APPs -  Physician Assistants and Nurse Practitioners) who all work together to provide you with the care you need, when you need it.  We recommend signing up for the patient portal called "MyChart".  Sign up information is provided on this After Visit Summary.  MyChart is used to connect with patients for Virtual Visits (Telemedicine).  Patients  are able to view lab/test results, encounter notes, upcoming appointments, etc.  Non-urgent messages can be sent to your provider as well.   To learn more about what you can do with MyChart, go to [email or web address].    Your next appointment:   2 month(s)  The format for your next appointment:   In Person  Provider:   Jyl Heinz, MD   Other Instructions NA

## 2019-12-05 LAB — BASIC METABOLIC PANEL
BUN/Creatinine Ratio: 17 (ref 10–24)
BUN: 17 mg/dL (ref 8–27)
CO2: 25 mmol/L (ref 20–29)
Calcium: 9.7 mg/dL (ref 8.6–10.2)
Chloride: 98 mmol/L (ref 96–106)
Creatinine, Ser: 1.03 mg/dL (ref 0.76–1.27)
GFR calc Af Amer: 88 mL/min/{1.73_m2} (ref >59)
GFR calc non Af Amer: 76 mL/min/{1.73_m2} (ref >59)
Glucose: 152 mg/dL — ABNORMAL HIGH (ref 65–99)
Potassium: 3.6 mmol/L (ref 3.5–5.2)
Sodium: 137 mmol/L (ref 134–144)

## 2019-12-12 ENCOUNTER — Telehealth (HOSPITAL_COMMUNITY): Payer: Self-pay | Admitting: *Deleted

## 2019-12-12 NOTE — Telephone Encounter (Signed)
Reaching out to patient to offer assistance regarding upcoming cardiac imaging study; pt verbalizes understanding of appt date/time, parking situation and where to check in, pre-test NPO status and medications ordered, and verified current allergies; name and call back number provided for further questions should they arise [facility] [phone] office [phone] cell

## 2019-12-13 ENCOUNTER — Ambulatory Visit (HOSPITAL_COMMUNITY)
Admission: RE | Admit: 2019-12-13 | Discharge: 2019-12-13 | Disposition: A | Discharge status: Home / Self Care | Payer: Medicare Other | Source: Ambulatory Visit | Attending: Cardiology | Admitting: Cardiology

## 2019-12-13 ENCOUNTER — Other Ambulatory Visit: Payer: Self-pay

## 2019-12-13 DIAGNOSIS — R931 Abnormal findings on diagnostic imaging of heart and coronary circulation: Secondary | ICD-10-CM | POA: Diagnosis present

## 2019-12-13 DIAGNOSIS — R9439 Abnormal result of other cardiovascular function study: Secondary | ICD-10-CM | POA: Insufficient documentation

## 2019-12-13 DIAGNOSIS — I2510 Atherosclerotic heart disease of native coronary artery without angina pectoris: Secondary | ICD-10-CM | POA: Diagnosis not present

## 2019-12-13 MED ORDER — IOHEXOL 350 MG/ML SOLN
80.0000 mL | Freq: Once | INTRAVENOUS | Status: AC | PRN
  Administered 2019-12-13: 80 mL via INTRAVENOUS

## 2019-12-13 MED ORDER — NITROGLYCERIN 0.4 MG SL SUBL
0.8000 mg | SUBLINGUAL_TABLET | Freq: Once | SUBLINGUAL | Status: AC
  Administered 2019-12-13: 0.8 mg via SUBLINGUAL

## 2019-12-13 MED ORDER — NITROGLYCERIN 0.4 MG SL SUBL
SUBLINGUAL_TABLET | SUBLINGUAL | Status: AC
  Filled 2019-12-13: qty 2

## 2019-12-14 ENCOUNTER — Telehealth: Payer: Self-pay

## 2019-12-14 DIAGNOSIS — Z6828 Body mass index (BMI) 28.0-28.9, adult: Secondary | ICD-10-CM | POA: Diagnosis not present

## 2019-12-14 DIAGNOSIS — I2510 Atherosclerotic heart disease of native coronary artery without angina pectoris: Secondary | ICD-10-CM | POA: Diagnosis not present

## 2019-12-14 NOTE — Telephone Encounter (Signed)
-----   Message from Jenean Lindau, MD sent at 12/14/2019 11:35 AM EDT ----- Please give patient an elective appointment in the next 2 to 3 weeks to discuss these findings.  Patient has calcium buildup in the arteries and some blockages but they are not critical and only medical therapy at this time.  The results of the study is unremarkable. Please inform patient. I will discuss in detail at next appointment. Cc  primary care/referring physician Jenean Lindau, MD 12/14/2019 11:35 AM

## 2019-12-14 NOTE — Telephone Encounter (Signed)
Spoke with patients wife regarding results and recommendation. She verbalizes understanding and we scheduled the patient to see Dr. Geraldo Pitter on 12/21/2019 to review these results.    Advised for patient to call back with any issues or concerns.

## 2019-12-21 ENCOUNTER — Ambulatory Visit (INDEPENDENT_AMBULATORY_CARE_PROVIDER_SITE_OTHER): Payer: Medicare Other | Admitting: Cardiology

## 2019-12-21 ENCOUNTER — Encounter: Payer: Self-pay | Admitting: Cardiology

## 2019-12-21 ENCOUNTER — Other Ambulatory Visit: Payer: Self-pay

## 2019-12-21 VITALS — BP 128/76 | HR 69 | Ht 72.0 in | Wt 209.0 lb

## 2019-12-21 DIAGNOSIS — R9439 Abnormal result of other cardiovascular function study: Secondary | ICD-10-CM

## 2019-12-21 DIAGNOSIS — I2510 Atherosclerotic heart disease of native coronary artery without angina pectoris: Secondary | ICD-10-CM | POA: Diagnosis not present

## 2019-12-21 DIAGNOSIS — E088 Diabetes mellitus due to underlying condition with unspecified complications: Secondary | ICD-10-CM

## 2019-12-21 DIAGNOSIS — I10 Essential (primary) hypertension: Secondary | ICD-10-CM

## 2019-12-21 DIAGNOSIS — E782 Mixed hyperlipidemia: Secondary | ICD-10-CM

## 2019-12-21 NOTE — Progress Notes (Signed)
Cardiology Office Note:    Date:  12/21/2019   ID:  David Wu, DOB [birth year], MRN [id number]  PCP:  Renaldo Reel, PA  Cardiologist:  Jenean Lindau, MD   Referring MD: Renaldo Reel, PA    ASSESSMENT:    1. Coronary artery disease involving native coronary artery of native heart without angina pectoris   2. Essential hypertension   3. Abnormal nuclear stress test   4. Diabetes mellitus due to underlying condition with unspecified complications ([place])   5. Mixed dyslipidemia    PLAN:    In order of problems listed above:  1. Coronary artery disease: Secondary prevention stressed with the patient.  Importance of compliance with diet and medication stressed and he vocalized understanding.  Results of CT scan were discussed with the patient at extensive length.  Questions were answered to satisfaction.  He is walking more than 45 minutes a day without any symptoms and has excellent effort tolerance and I congratulated him about this. 2. Essential hypertension: Blood pressure stable 3. Mixed dyslipidemia: Lipids were reviewed from recent and they were fine 4. Diabetes mellitus: Diet was emphasized and he is followed for this by his primary care physician.  He tells me that his last hemoglobin A1c was 6 5. Patient will be seen in follow-up appointment in 6 months or earlier if the patient has any concerns.  He will have blood work before his next visit.   Medication Adjustments/Labs and Tests Ordered: Current medicines are reviewed at length with the patient today.  Concerns regarding medicines are outlined above.  No orders of the defined types were placed in this encounter.  No orders of the defined types were placed in this encounter.    Chief Complaint  Patient presents with  . Follow-up    CT Results      History of Present Illness:    David Wu is a 64 y.o. male.  Patient has history of essential hypertension dyslipidemia and diabetes mellitus.   He has significant calcification in the coronary arteries unfortunately FFR was unremarkable.  He denies any problems at this time and takes care of activities of daily living.  No chest pain orthopnea or PND.  At the time of my evaluation, the patient is alert awake oriented and in no distress.  He walks more than 45 minutes a day on a daily basis.  Past Medical History:  Diagnosis Date  . Bleeding ulcer   . Chronic fatigue 04/22/2015  . Diabetes ([place])   . Diabetes mellitus due to underlying condition with unspecified complications ([place]) [id number]  . Essential hypertension 04/22/2015  . Hyperlipidemia   . Hypertension   . Kidney stones   . Mixed dyslipidemia 07/20/2019  . Nonspecific abnormal electrocardiogram (ECG) (EKG) 07/20/2019  . Shortness of breath 04/22/2015  . Type 2 diabetes mellitus without complication ([place]) [id number]    Past Surgical History:  Procedure Laterality Date  . EYE SURGERY Left     Current Medications: Current Meds  Medication Sig  . amLODipine-benazepril (LOTREL) 10-40 MG capsule Take by mouth. 1 tab daily  . atorvastatin (LIPITOR) 40 MG tablet Take by mouth. Take 1 tab daily  . cloNIDine (CATAPRES) 0.1 MG tablet Take by mouth. 1 tab twice daily  . Coenzyme Q10 (CO Q-10) 100 MG CHEW Chew 100 mg by mouth daily.  . Continuous Blood Gluc Sensor (FREESTYLE LIBRE 14 DAY SENSOR) MISC See admin instructions.  . Cyanocobalamin 2000 MCG TBCR Take by mouth once  a week.   . ergocalciferol (VITAMIN D2) 1.25 MG (50000 UT) capsule Take by mouth. weekly  . glimepiride (AMARYL) 2 MG tablet Take 2 mg by mouth 2 (two) times daily.  . hydrochlorothiazide (HYDRODIURIL) 25 MG tablet Take by mouth. Take 1 tab daily  . labetalol (NORMODYNE) 200 MG tablet Take by mouth. Take 1 tab twice daily  . Magnesium 200 MG TABS Take by mouth. 100 mg daily  . metFORMIN (GLUCOPHAGE) 500 MG tablet Take by mouth. Take 1 tab twice dialy  . nitroGLYCERIN (NITROSTAT) 0.4 MG SL tablet Place 1  tablet (0.4 mg total) under the tongue every 5 (five) minutes as needed for chest pain.  Marland Kitchen omeprazole (PRILOSEC) 40 MG capsule Take by mouth. Take 1 tab daily  . pioglitazone (ACTOS) 45 MG tablet Take 45 mg by mouth daily.  . Potassium Chloride ER 20 MEQ TBCR Take 20 mg by mouth 2 (two) times daily.  . tamsulosin (FLOMAX) 0.4 MG CAPS capsule Take by mouth. Take 1 tab daily     Allergies:   Patient has no known allergies.   Social History   Socioeconomic History  . Marital status: Single    Spouse name: Not on file  . Number of children: Not on file  . Years of education: Not on file  . Highest education level: Not on file  Occupational History  . Not on file  Tobacco Use  . Smoking status: Never Smoker  . Smokeless tobacco: Never Used  Substance and Sexual Activity  . Alcohol use: Not Currently  . Drug use: Never  . Sexual activity: Not on file  Other Topics Concern  . Not on file  Social History Narrative  . Not on file   Social Determinants of Health   Financial Resource Strain:   . Difficulty of Paying Living Expenses:   Food Insecurity:   . Worried About Charity fundraiser in the Last Year:   . Arboriculturist in the Last Year:   Transportation Needs:   . Film/video editor (Medical):   Marland Kitchen Lack of Transportation (Non-Medical):   Physical Activity:   . Days of Exercise per Week:   . Minutes of Exercise per Session:   Stress:   . Feeling of Stress :   Social Connections:   . Frequency of Communication with Friends and Family:   . Frequency of Social Gatherings with Friends and Family:   . Attends Religious Services:   . Active Member of Clubs or Organizations:   . Attends Archivist Meetings:   Marland Kitchen Marital Status:      Family History: The patient's family history includes Alzheimer's disease in his mother; Celiac disease in his brother; Diabetes in his father; Hypertension in his father; Stroke in his father.  ROS:   Please see the history of  present illness.    All other systems reviewed and are negative.  EKGs/Labs/Other Studies Reviewed:    The following studies were reviewed today: Study Highlights   The left ventricular ejection fraction is normal (55-65%).  Nuclear stress EF: 57%.  There was no ST segment deviation noted during stress.  No T wave inversion was noted during stress.  Defect 1: There is a medium defect of moderate severity present in the basal inferoseptal and basal inferior location with mild hypokinesis.  Findings consistent with prior myocardial infarction with peri-infarct ischemia.  This is an intermediate risk study.      Recent Labs: 09/26/2019: Hemoglobin 13.3; Platelets  121; TSH 3.440 10/04/2019: ALT 19 12/05/2019: BUN 17; Creatinine, Ser 1.03; Potassium 3.6; Sodium 137  Recent Lipid Panel    Component Value Date/Time   CHOL 126 10/04/2019 0943   TRIG 128 10/04/2019 0943   HDL 38 (L) 10/04/2019 0943   CHOLHDL 3.3 10/04/2019 0943   LDLCALC 65 10/04/2019 0943    Physical Exam:    VS:  BP 128/76   Pulse 69   Ht 6' (1.829 m)   Wt 209 lb (94.8 kg)   SpO2 98%   BMI 28.35 kg/m     Wt Readings from Last 3 Encounters:  12/21/19 209 lb (94.8 kg)  11/20/19 209 lb 9.6 oz (95.1 kg)  10/24/19 216 lb (98 kg)     GEN: Patient is in no acute distress HEENT: Normal NECK: No JVD; No carotid bruits LYMPHATICS: No lymphadenopathy CARDIAC: Hear sounds regular, 2/6 systolic murmur at the apex. RESPIRATORY:  Clear to auscultation without rales, wheezing or rhonchi  ABDOMEN: Soft, non-tender, non-distended MUSCULOSKELETAL:  No edema; No deformity  SKIN: Warm and dry NEUROLOGIC:  Alert and oriented x 3 PSYCHIATRIC:  Normal affect   Signed, Jenean Lindau, MD  12/21/2019 9:24 AM    [place]

## 2019-12-21 NOTE — Patient Instructions (Signed)
Medication Instructions:  No medication changes. *If you need a refill on your cardiac medications before your next appointment, please call your pharmacy*   Lab Work: Your physician recommends that you return for lab work in: before your next visit. You need to have labs done when you are fasting.  You can come Monday through Friday 8:30 am to 12:00 pm and 1:15 to 4:30. You do not need to make an appointment as the order has already been placed. The labs you are going to have done are BMET, CBC, TSH, LFT, HgB A1C and Lipids.    If you have labs (blood work) drawn today and your tests are completely normal, you will receive your results only by: Marland Kitchen MyChart Message (if you have MyChart) OR . A paper copy in the mail If you have any lab test that is abnormal or we need to change your treatment, we will call you to review the results.   Testing/Procedures: None ordered   Follow-Up: At [facility], you and your health needs are our priority.  As part of our continuing mission to provide you with exceptional heart care, we have created designated Provider Care Teams.  These Care Teams include your primary Cardiologist (physician) and Advanced Practice Providers (APPs -  Physician Assistants and Nurse Practitioners) who all work together to provide you with the care you need, when you need it.  We recommend signing up for the patient portal called "MyChart".  Sign up information is provided on this After Visit Summary.  MyChart is used to connect with patients for Virtual Visits (Telemedicine).  Patients are able to view lab/test results, encounter notes, upcoming appointments, etc.  Non-urgent messages can be sent to your provider as well.   To learn more about what you can do with MyChart, go to [email or web address].    Your next appointment:   6 month(s)  The format for your next appointment:   In Person  Provider:   Jyl Heinz, MD   Other Instructions NA

## 2019-12-24 ENCOUNTER — Ambulatory Visit: Payer: Medicare Other | Admitting: Cardiology

## 2020-01-17 ENCOUNTER — Ambulatory Visit: Payer: Medicare Other | Admitting: Cardiology

## 2020-10-28 IMAGING — CT CT HEART MORP W/ CTA COR W/ SCORE W/ CA W/CM &/OR W/O CM
1 series · 6 of 8 positions shown, 8 images · non-contrast
Comparison: None.
COMPARISON: None.

Addendum:
EXAM:
OVER-READ INTERPRETATION  CT CHEST

The following report is an over-read performed by radiologist Dr.
Danii Aujla [[email or web address]] on 12/13/2019. This
over-read does not include interpretation of cardiac or coronary
anatomy or pathology. The coronary calcium score/coronary CTA
interpretation by the cardiologist is attached.
CLINICAL DATA: Abnormal SPECT
Cardiac/Coronary CTA
TECHNIQUE: The patient was scanned on a [employer] Force scanner. A 100 kV
prospective scan was triggered in the descending thoracic aorta at
111 HU's. Axial non-contrast 3 mm slices were carried out through
the heart. The data set was analyzed on a dedicated work station and
scored using the Agatson method. Gantry rotation speed was 250 msecs
and collimation was .6 mm. No beta blockade and 0.8 mg of sl NTG was
given. The 3D data set was reconstructed in 5% intervals of the
35-75 % of the R-R cycle. Diastolic phases were analyzed on a
dedicated work station using MPR, MIP and VRT modes. The patient
received 80 cc of contrast.

[Series 570: findings · 6 of 8 slices shown, 8 images]
[im 2/8  vessel]
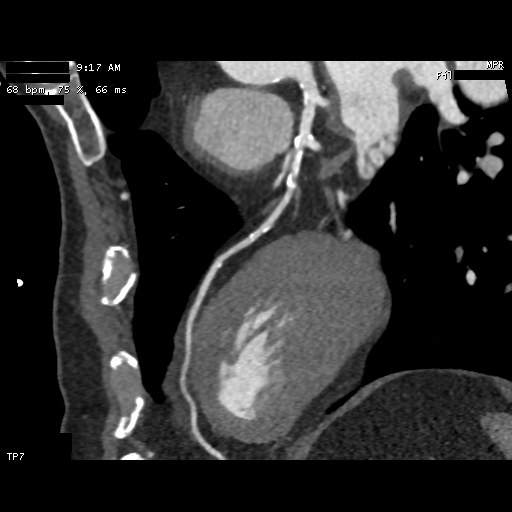
[im 2/8  lung]
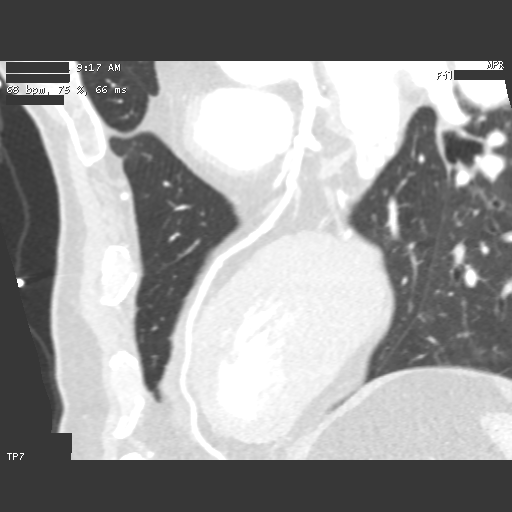
[im 3/8  vessel]
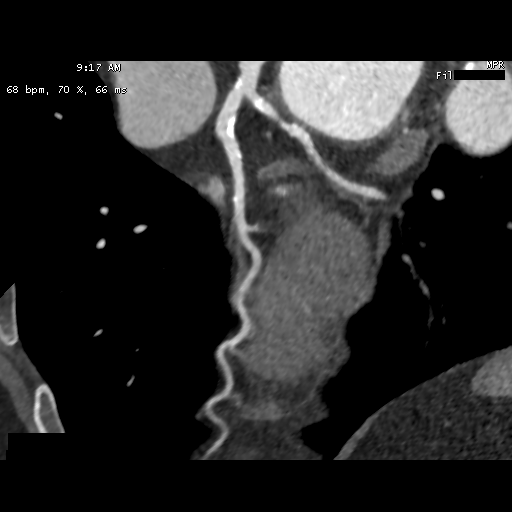
[im 4/8  vessel]
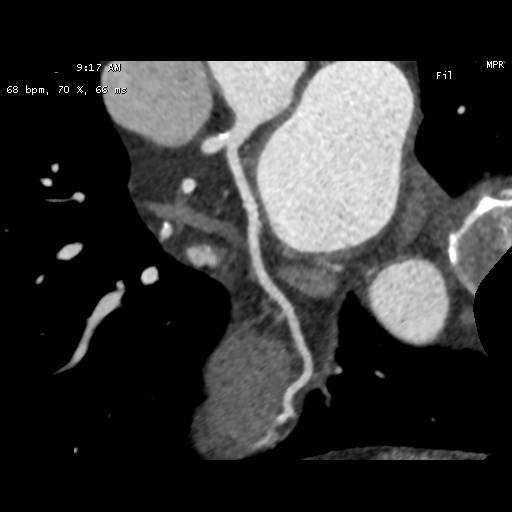
[im 5/8  vessel]
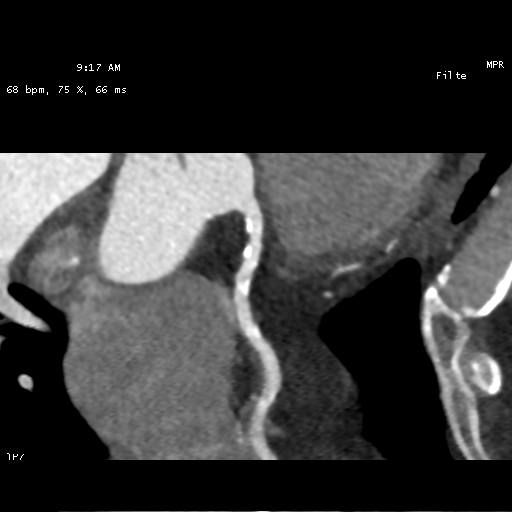
[im 6/8  vessel]
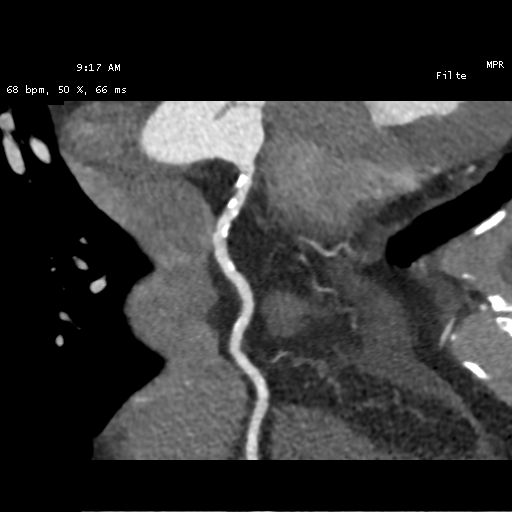
[im 6/8  lung]
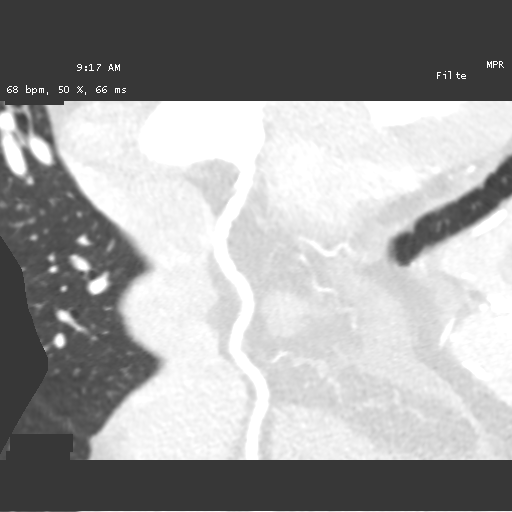
[im 7/8  vessel]
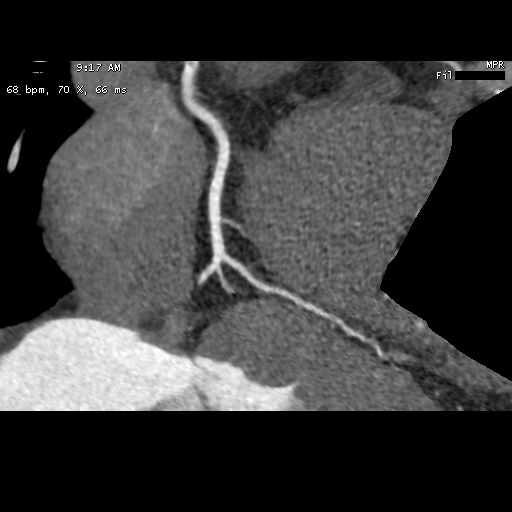

[6 of 8 positions shown; findings below may reference images not displayed]

FINDINGS: Aortic atherosclerosis. Within the visualized portions of the thorax
there are no suspicious appearing pulmonary nodules or masses, there
is no acute consolidative airspace disease, no pleural effusions, no
pneumothorax and no lymphadenopathy. Visualized portions of the
upper abdomen are unremarkable. There are no aggressive appearing
lytic or blastic lesions noted in the visualized portions of the
skeleton.
IMPRESSION: 1.  Aortic Atherosclerosis (DV24Z-GYW.W).
FINDINGS: Image quality: excellent.

Noise artifact is: Limited.

Coronary Arteries:  Normal coronary origin.  Right dominance.

Left main: The left main is a large caliber vessel with a normal
take off from the left coronary cusp that bifurcates to form a left
anterior descending artery and a left circumflex artery. There is
minimal calcified plaque (<25%).

Left anterior descending artery: The proximal LAD contains mild
calcified plaque (25-49%). The mid LAD contains heavily calcified
circumferential plaque concerning for a moderate stenosis (50-69%).
The distal LAD contains minimal mixed density plaque (<25%). The LAD
gives off 1 large diagonal branch with minimal mixed density plaque
(<25%).

Left circumflex artery: The LCX is non-dominant. There is minimal
calcified plaque (<25%) in the proximal segment. The mid and distal
segments are patent. The LCX gives off 1 patent obtuse marginal
branch.

Right coronary artery: The RCA is dominant with normal take off from
the right coronary cusp. There is mild cardiac motion artifact in
the mid RCA that is easily cleared through late systolic phase
assessment. The proximal and mid RCA segments contain minimal
calcified plaque (<25%). The distal RCA is patent. The RCA
terminates as a PDA and right posterolateral branch without evidence
of plaque or stenosis.

Right Atrium: Right atrial size is within normal limits.

Right Ventricle: The right ventricular cavity is within normal
limits.

Left Atrium: Left atrial size is normal in size with no left atrial
appendage filling defect.

Left Ventricle: The ventricular cavity size is within normal limits.
There are no stigmata of prior infarction. There is no abnormal
filling defect.

Pulmonary arteries: Normal in size without proximal filling defect.

Pulmonary veins: Normal pulmonary venous drainage.

Pericardium: Normal thickness with no significant effusion or
calcium present.

Cardiac valves: The aortic valve is trileaflet without significant
calcification. The mitral valve is normal structure without
significant calcification.

Aorta: Normal caliber with no significant disease.

Extra-cardiac findings: See attached radiology report for
non-cardiac structures.
IMPRESSION: 1. Coronary calcium score of 1101. This was 93rd percentile for age
and sex matched control.

2. Normal coronary origin with right dominance.

3. Moderate mid LAD calcified plaque that is circumferential
(50-69%).

4. Minimal plaque (<25%) in the LCX/RCA.

RECOMMENDATIONS:
1. Moderate stenosis in the mid LAD (50-69%). CT FFR will be sent.
No significant ischemia seen in the LAD distribution on recent
SPECT. No signs of significant plaque to explain the inferior wall
defect seen on SPECT.

*** End of Addendum ***
EXAM:
OVER-READ INTERPRETATION  CT CHEST

The following report is an over-read performed by radiologist Dr.
Danii Aujla [REDACTED] on 12/13/2019. This
over-read does not include interpretation of cardiac or coronary
anatomy or pathology. The coronary calcium score/coronary CTA
interpretation by the cardiologist is attached.
FINDINGS: Aortic atherosclerosis. Within the visualized portions of the thorax
there are no suspicious appearing pulmonary nodules or masses, there
is no acute consolidative airspace disease, no pleural effusions, no
pneumothorax and no lymphadenopathy. Visualized portions of the
upper abdomen are unremarkable. There are no aggressive appearing
lytic or blastic lesions noted in the visualized portions of the
skeleton.
IMPRESSION: 1.  Aortic Atherosclerosis (DV24Z-GYW.W).

## 2020-11-06 ENCOUNTER — Encounter: Payer: Self-pay | Admitting: Cardiology

## 2020-11-06 ENCOUNTER — Encounter: Payer: Self-pay | Admitting: *Deleted

## 2020-11-10 DIAGNOSIS — E119 Type 2 diabetes mellitus without complications: Secondary | ICD-10-CM | POA: Insufficient documentation

## 2020-11-10 DIAGNOSIS — C4490 Unspecified malignant neoplasm of skin, unspecified: Secondary | ICD-10-CM | POA: Insufficient documentation

## 2020-11-10 DIAGNOSIS — K259 Gastric ulcer, unspecified as acute or chronic, without hemorrhage or perforation: Secondary | ICD-10-CM | POA: Insufficient documentation

## 2020-11-10 DIAGNOSIS — K284 Chronic or unspecified gastrojejunal ulcer with hemorrhage: Secondary | ICD-10-CM | POA: Insufficient documentation

## 2020-11-10 DIAGNOSIS — E785 Hyperlipidemia, unspecified: Secondary | ICD-10-CM | POA: Insufficient documentation

## 2020-11-10 DIAGNOSIS — I10 Essential (primary) hypertension: Secondary | ICD-10-CM | POA: Insufficient documentation

## 2020-11-10 DIAGNOSIS — H9190 Unspecified hearing loss, unspecified ear: Secondary | ICD-10-CM | POA: Insufficient documentation

## 2020-11-10 DIAGNOSIS — K274 Chronic or unspecified peptic ulcer, site unspecified, with hemorrhage: Secondary | ICD-10-CM | POA: Insufficient documentation

## 2020-11-10 DIAGNOSIS — N200 Calculus of kidney: Secondary | ICD-10-CM | POA: Insufficient documentation

## 2020-11-11 ENCOUNTER — Other Ambulatory Visit: Payer: Self-pay

## 2020-11-11 ENCOUNTER — Encounter: Payer: Self-pay | Admitting: Cardiology

## 2020-11-11 ENCOUNTER — Ambulatory Visit (INDEPENDENT_AMBULATORY_CARE_PROVIDER_SITE_OTHER): Payer: Medicare Other | Admitting: Cardiology

## 2020-11-11 VITALS — BP 152/76 | HR 69 | Ht 72.0 in | Wt 212.4 lb

## 2020-11-11 DIAGNOSIS — I10 Essential (primary) hypertension: Secondary | ICD-10-CM

## 2020-11-11 DIAGNOSIS — I2510 Atherosclerotic heart disease of native coronary artery without angina pectoris: Secondary | ICD-10-CM

## 2020-11-11 DIAGNOSIS — E782 Mixed hyperlipidemia: Secondary | ICD-10-CM

## 2020-11-11 DIAGNOSIS — E088 Diabetes mellitus due to underlying condition with unspecified complications: Secondary | ICD-10-CM

## 2020-11-11 MED ORDER — RANOLAZINE ER 500 MG PO TB12: 500.0000 mg | ORAL_TABLET | Freq: Two times a day (BID) | ORAL | 0 refills | Status: DC

## 2020-11-11 MED ORDER — RANOLAZINE ER 1000 MG PO TB12: 1000.0000 mg | ORAL_TABLET | Freq: Two times a day (BID) | ORAL | 3 refills | Status: DC

## 2020-11-11 NOTE — Patient Instructions (Signed)
Medication Instructions:  Your physician has recommended you make the following change in your medication:   Start Ranexa 500 mg twice a day for 2 weeks then increase to 1000 mg twice a day.   *If you need a refill on your cardiac medications before your next appointment, please call your pharmacy*   Lab Work: None ordered If you have labs (blood work) drawn today and your tests are completely normal, you will receive your results only by: Marland Kitchen MyChart Message (if you have MyChart) OR . A paper copy in the mail If you have any lab test that is abnormal or we need to change your treatment, we will call you to review the results.   Testing/Procedures: None ordered   Follow-Up: At [facility], you and your health needs are our priority.  As part of our continuing mission to provide you with exceptional heart care, we have created designated Provider Care Teams.  These Care Teams include your primary Cardiologist (physician) and Advanced Practice Providers (APPs -  Physician Assistants and Nurse Practitioners) who all work together to provide you with the care you need, when you need it.  We recommend signing up for the patient portal called "MyChart".  Sign up information is provided on this After Visit Summary.  MyChart is used to connect with patients for Virtual Visits (Telemedicine).  Patients are able to view lab/test results, encounter notes, upcoming appointments, etc.  Non-urgent messages can be sent to your provider as well.   To learn more about what you can do with MyChart, go to [email or web address].    Your next appointment:   6 week(s)  The format for your next appointment:   In Person  Provider:   Jyl Heinz, MD   Other Instructions NA

## 2020-11-11 NOTE — Progress Notes (Signed)
Cardiology Office Note:    Date:  11/11/2020   ID:  David Wu, DOB 1956/02/05, MRN [id number]  PCP:  Renaldo Reel, PA  Cardiologist:  Jenean Lindau, MD   Referring MD: Renaldo Reel, PA    ASSESSMENT:    1. Coronary artery disease involving native coronary artery of native heart without angina pectoris   2. Essential hypertension   3. Mixed hyperlipidemia   4. Diabetes mellitus due to underlying condition with unspecified complications ([place])   5. Mixed dyslipidemia    PLAN:    In order of problems listed above:  1. Coronary artery disease: Secondary prevention stressed with patient.  Importance of compliance with diet medication stressed any vocalized understanding.  He had multiple risk factors for continued progression of coronary artery disease.  I discussed with him about his severity of his fatigability.  I encouraged him to join the cardiac rehab program and is agreeable.  I started him on Ranexa 500 mg twice daily and then 1 g twice daily.  He is agreeable.  Benefits and potential risks of the drug explained and he vocalized understanding and questions were answered to satisfaction. 2. Essential hypertension: Blood pressure stable and diet was emphasized. 3. Mixed dyslipidemia and diabetes mellitus: Lipids were reviewed diet emphasized.  I think the cardiac rehab program will help optimize these issues also. 4. Patient will be seen in follow-up appointment in 6 weeks or earlier if the patient has any concerns    Medication Adjustments/Labs and Tests Ordered: Current medicines are reviewed at length with the patient today.  Concerns regarding medicines are outlined above.  No orders of the defined types were placed in this encounter.  No orders of the defined types were placed in this encounter.    No chief complaint on file.    History of Present Illness:    David Wu is a 65 y.o. male.  Patient has past medical history of coronary artery  disease, essential hypertension, dyslipidemia and diabetes mellitus.  He tells me that he now has easy fatigability and feels tired when he exercises.  No chest pain orthopnea or PND.  No anginal type symptoms.  At the time of my evaluation, the patient is alert awake oriented and in no distress.  Past Medical History:  Diagnosis Date  . Abnormal nuclear stress test 11/20/2019  . Bleeding ulcer   . CAD (coronary artery disease) 11/20/2019  . Chronic fatigue 04/22/2015  . Diabetes ([place])   . Diabetes mellitus due to underlying condition with unspecified complications ([place]) 84/69/6295  . Essential hypertension 04/22/2015  . Hearing loss   . Hyperlipidemia   . Hypertension, essential, benign   . Kidney stones   . Mixed dyslipidemia 07/20/2019  . Nonspecific abnormal electrocardiogram (ECG) (EKG) 07/20/2019  . Shortness of breath 04/22/2015  . Skin cancer   . Stomach ulcer   . Type 2 diabetes mellitus without complication ([place]) 2/84/1324    Past Surgical History:  Procedure Laterality Date  . CATARACT EXTRACTION Left 2008  . CATARACT EXTRACTION Right 2018    Current Medications: Current Meds  Medication Sig  . amLODipine-benazepril (LOTREL) 10-40 MG capsule Take 1 capsule by mouth daily.  Marland Kitchen aspirin EC 81 MG tablet Take 81 mg by mouth daily. Swallow whole.  Marland Kitchen atorvastatin (LIPITOR) 40 MG tablet Take 40 mg by mouth daily.  . cloNIDine (CATAPRES) 0.1 MG tablet Take 0.1 mg by mouth 2 (two) times daily.  . Coenzyme Q10 (CO Q-10) 100  MG CHEW Chew 100 mg by mouth daily.  . Cyanocobalamin 2000 MCG TBCR Take 2,000 mcg by mouth once a week.  Marland Kitchen glimepiride (AMARYL) 1 MG tablet Take 1 mg by mouth 2 (two) times daily.  . hydrochlorothiazide (HYDRODIURIL) 25 MG tablet Take 25 mg by mouth daily.  Marland Kitchen labetalol (NORMODYNE) 200 MG tablet Take 400 mg by mouth 2 (two) times daily.  . metFORMIN (GLUCOPHAGE) 500 MG tablet Take 500 mg by mouth 2 (two) times daily with a meal.  . nitroGLYCERIN (NITROSTAT) 0.4  MG SL tablet Place 0.4 mg under the tongue every 5 (five) minutes as needed for chest pain.  Marland Kitchen omeprazole (PRILOSEC) 40 MG capsule Take 40 mg by mouth daily.  . pioglitazone (ACTOS) 45 MG tablet Take 45 mg by mouth daily.  . potassium chloride SA (KLOR-CON) 20 MEQ tablet Take 20 mEq by mouth 2 (two) times daily.  . tamsulosin (FLOMAX) 0.4 MG CAPS capsule Take 0.4 mg by mouth daily.     Allergies:   Patient has no known allergies.   Social History   Socioeconomic History  . Marital status: Single    Spouse name: Not on file  . Number of children: Not on file  . Years of education: Not on file  . Highest education level: Not on file  Occupational History  . Not on file  Tobacco Use  . Smoking status: Never Smoker  . Smokeless tobacco: Never Used  Substance and Sexual Activity  . Alcohol use: Not Currently  . Drug use: Never  . Sexual activity: Not on file  Other Topics Concern  . Not on file  Social History Narrative  . Not on file   Social Determinants of Health   Financial Resource Strain: Not on file  Food Insecurity: Not on file  Transportation Needs: Not on file  Physical Activity: Not on file  Stress: Not on file  Social Connections: Not on file     Family History: The patient's family history includes Alzheimer's disease in his mother; Celiac disease in his brother; Diabetes in his father; Hypertension in his father; Stroke in his father.  ROS:   Please see the history of present illness.    All other systems reviewed and are negative.  EKGs/Labs/Other Studies Reviewed:    The following studies were reviewed today: EKG with sinus rhythm and nonspecific ST-T changes.  T wave inversions in lateral leads.   Recent Labs: 12/05/2019: BUN 17; Creatinine, Ser 1.03; Potassium 3.6; Sodium 137  Recent Lipid Panel    Component Value Date/Time   CHOL 126 10/04/2019 0943   TRIG 128 10/04/2019 0943   HDL 38 (L) 10/04/2019 0943   CHOLHDL 3.3 10/04/2019 0943    LDLCALC 65 10/04/2019 0943    Physical Exam:    VS:  BP (!) 152/76   Pulse 69   Ht 6' (1.829 m)   Wt 212 lb 6.4 oz (96.3 kg)   SpO2 98%   BMI 28.81 kg/m     Wt Readings from Last 3 Encounters:  11/11/20 212 lb 6.4 oz (96.3 kg)  11/06/20 214 lb (97.1 kg)  12/21/19 209 lb (94.8 kg)     GEN: Patient is in no acute distress HEENT: Normal NECK: No JVD; No carotid bruits LYMPHATICS: No lymphadenopathy CARDIAC: Hear sounds regular, 2/6 systolic murmur at the apex. RESPIRATORY:  Clear to auscultation without rales, wheezing or rhonchi  ABDOMEN: Soft, non-tender, non-distended MUSCULOSKELETAL:  No edema; No deformity  SKIN: Warm and dry NEUROLOGIC:  Alert and oriented x 3 PSYCHIATRIC:  Normal affect   Signed, Jenean Lindau, MD  11/11/2020 10:09 AM    [place]

## 2020-11-22 ENCOUNTER — Other Ambulatory Visit: Payer: Self-pay | Admitting: Cardiology

## 2020-11-22 DIAGNOSIS — I2510 Atherosclerotic heart disease of native coronary artery without angina pectoris: Secondary | ICD-10-CM

## 2020-12-10 ENCOUNTER — Other Ambulatory Visit: Payer: Self-pay

## 2020-12-11 ENCOUNTER — Encounter: Payer: Self-pay | Admitting: Cardiology

## 2020-12-11 NOTE — Telephone Encounter (Signed)
error 

## 2020-12-22 ENCOUNTER — Ambulatory Visit: Payer: Medicare Other | Admitting: Cardiology

## 2020-12-25 ENCOUNTER — Other Ambulatory Visit: Payer: Self-pay

## 2020-12-25 DIAGNOSIS — Z6827 Body mass index (BMI) 27.0-27.9, adult: Secondary | ICD-10-CM | POA: Insufficient documentation

## 2020-12-25 DIAGNOSIS — N183 Chronic kidney disease, stage 3 unspecified: Secondary | ICD-10-CM | POA: Insufficient documentation

## 2020-12-25 DIAGNOSIS — E785 Hyperlipidemia, unspecified: Secondary | ICD-10-CM | POA: Insufficient documentation

## 2020-12-26 ENCOUNTER — Ambulatory Visit (INDEPENDENT_AMBULATORY_CARE_PROVIDER_SITE_OTHER): Payer: Medicare Other

## 2020-12-26 ENCOUNTER — Other Ambulatory Visit: Payer: Self-pay

## 2020-12-26 ENCOUNTER — Ambulatory Visit (INDEPENDENT_AMBULATORY_CARE_PROVIDER_SITE_OTHER): Payer: Medicare Other | Admitting: Cardiology

## 2020-12-26 ENCOUNTER — Encounter: Payer: Self-pay | Admitting: Cardiology

## 2020-12-26 VITALS — BP 164/86 | HR 82 | Ht 72.0 in | Wt 219.6 lb

## 2020-12-26 DIAGNOSIS — E782 Mixed hyperlipidemia: Secondary | ICD-10-CM

## 2020-12-26 DIAGNOSIS — E1122 Type 2 diabetes mellitus with diabetic chronic kidney disease: Secondary | ICD-10-CM

## 2020-12-26 DIAGNOSIS — I10 Essential (primary) hypertension: Secondary | ICD-10-CM

## 2020-12-26 DIAGNOSIS — R42 Dizziness and giddiness: Secondary | ICD-10-CM

## 2020-12-26 DIAGNOSIS — N183 Chronic kidney disease, stage 3 unspecified: Secondary | ICD-10-CM

## 2020-12-26 DIAGNOSIS — I129 Hypertensive chronic kidney disease with stage 1 through stage 4 chronic kidney disease, or unspecified chronic kidney disease: Secondary | ICD-10-CM

## 2020-12-26 DIAGNOSIS — I2510 Atherosclerotic heart disease of native coronary artery without angina pectoris: Secondary | ICD-10-CM | POA: Diagnosis not present

## 2020-12-26 MED ORDER — ICOSAPENT ETHYL 1 G PO CAPS
2.0000 g | ORAL_CAPSULE | Freq: Two times a day (BID) | ORAL | 12 refills | Status: DC
Start: 2020-12-26 — End: 2021-01-08

## 2020-12-26 MED ORDER — CLONIDINE HCL 0.2 MG PO TABS: 0.2000 mg | ORAL_TABLET | Freq: Two times a day (BID) | ORAL | 3 refills | Status: DC

## 2020-12-26 NOTE — Progress Notes (Signed)
Cardiology Office Note:    Date:  12/26/2020   ID:  David Wu, DOB 1955-08-24, MRN [id number]  PCP:  Renaldo Reel, PA  Cardiologist:  Jenean Lindau, MD   Referring MD: Renaldo Reel, PA    ASSESSMENT:    1. Coronary artery disease involving native coronary artery of native heart without angina pectoris   2. Essential hypertension   3. Mixed hyperlipidemia   4. Type 2 diabetes mellitus with stage 3 chronic kidney disease and hypertension ([place])    PLAN:    In order of problems listed above:  1. Coronary artery disease: Secondary prevention stressed with the patient.  Importance of compliance with diet medication stressed he vocalized understanding.  In view of the symptoms I would like to do an exercise stress Cardiolite.  He had nonobstructive disease as mentioned below in evaluation last year.  Fall precautions were advised.  Because of his syncopal spells he was advised not to drive. 2. Essential hypertension: Blood pressure is elevated but I do not want to further control it aggressively because of issues of hypotension and backing up. 3. Dizzy spells: I will do a 2-week monitor to assess his symptoms I can assess for any rhythm issues.  4. Mixed dyslipidemia diabetes mellitus: Lifestyle modification was urged weight reduction was stressed and he vocalized understanding and promises to comply. 5. He will be seen in follow-up appointment in a month or earlier if he has any concerns.   Medication Adjustments/Labs and Tests Ordered: Current medicines are reviewed at length with the patient today.  Concerns regarding medicines are outlined above.  No orders of the defined types were placed in this encounter.  No orders of the defined types were placed in this encounter.    No chief complaint on file.    History of Present Illness:    David Wu is a 65 y.o. male.  He has past medical history of coronary artery disease, essential hypertension,  dyslipidemia and diabetes mellitus.  He mentions to me that he blacked out when he was doing cardiac rehab.  No orthopnea or PND.  Occasionally feels dizzy and therefore he was put on hold for rehab and sent for evaluation.  Since the past 1 month he has not had any passing out spells.  This happened a month ago.  His wife accompanies him for this visit.  At the time of my evaluation, the patient is alert awake oriented and in no distress.  Past Medical History:  Diagnosis Date  . Abnormal nuclear stress test 11/20/2019  . Bleeding ulcer   . BMI 27.0-27.9,adult   . CAD (coronary artery disease) 11/20/2019  . Chronic fatigue 04/22/2015  . Diabetes ([place])   . Diabetes mellitus due to underlying condition with unspecified complications ([place]) 95/28/4132  . Essential hypertension 04/22/2015  . Hearing loss   . Hyperlipemia   . Hyperlipidemia   . Hypertension, essential, benign   . Kidney stones   . Mixed dyslipidemia 07/20/2019  . Nonspecific abnormal electrocardiogram (ECG) (EKG) 07/20/2019  . Shortness of breath 04/22/2015  . Skin cancer   . Stomach ulcer   . 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 Type 2 diabetes mellitus without complication ([place]) 4/40/1027    Past Surgical History:  Procedure Laterality Date  . CATARACT EXTRACTION Left 2008  . CATARACT EXTRACTION Right 2018    Current Medications: Current Meds  Medication Sig  . amLODipine-benazepril (LOTREL)  10-40 MG capsule Take 1 capsule by mouth daily.  Marland Kitchen aspirin EC 81 MG tablet Take 81 mg by mouth daily. Swallow whole.  Marland Kitchen atorvastatin (LIPITOR) 40 MG tablet Take 40 mg by mouth daily.  . cloNIDine (CATAPRES) 0.1 MG tablet Take 0.1 mg by mouth 2 (two) times daily.  . Coenzyme Q10 (CO Q-10) 100 MG CHEW Chew 100 mg by mouth daily.  . Cyanocobalamin 2000 MCG TBCR Take 2,000 mcg by mouth once a week.  Marland Kitchen glimepiride (AMARYL) 1 MG tablet Take 1 mg by mouth 2 (two) times daily.  .  hydrochlorothiazide (HYDRODIURIL) 25 MG tablet Take 25 mg by mouth daily.  Marland Kitchen labetalol (NORMODYNE) 200 MG tablet Take 400 mg by mouth 2 (two) times daily.  . metFORMIN (GLUCOPHAGE) 500 MG tablet Take 500 mg by mouth 2 (two) times daily with a meal.  . nitroGLYCERIN (NITROSTAT) 0.4 MG SL tablet Place 0.4 mg under the tongue every 5 (five) minutes as needed for chest pain.  Marland Kitchen omeprazole (PRILOSEC) 40 MG capsule Take 40 mg by mouth daily.  . pioglitazone (ACTOS) 45 MG tablet Take 45 mg by mouth daily.  . potassium chloride SA (KLOR-CON) 20 MEQ tablet Take 20 mEq by mouth 2 (two) times daily.  . ranolazine (RANEXA) 1000 MG SR tablet Take 1 tablet (1,000 mg total) by mouth 2 (two) times daily.  . tamsulosin (FLOMAX) 0.4 MG CAPS capsule Take 0.4 mg by mouth daily.  . Vitamin D, Ergocalciferol, (DRISDOL) 1.25 MG (50000 UNIT) CAPS capsule Take 1 capsule by mouth once a week.     Allergies:   Patient has no known allergies.   Social History   Socioeconomic History  . Marital status: Single    Spouse name: Not on file  . Number of children: Not on file  . Years of education: Not on file  . Highest education level: Not on file  Occupational History  . Not on file  Tobacco Use  . Smoking status: Never Smoker  . Smokeless tobacco: Never Used  Substance and Sexual Activity  . Alcohol use: Not Currently  . Drug use: Never  . Sexual activity: Not on file  Other Topics Concern  . Not on file  Social History Narrative  . Not on file   Social Determinants of Health   Financial Resource Strain: Not on file  Food Insecurity: Not on file  Transportation Needs: Not on file  Physical Activity: Not on file  Stress: Not on file  Social Connections: Not on file     Family History: The patient's family history includes Alzheimer's disease in his mother; Celiac disease in his brother; Diabetes in his father; Hypertension in his father; Stroke in his father.  ROS:   Please see the history of  present illness.    All other systems reviewed and are negative.  EKGs/Labs/Other Studies Reviewed:    The following studies were reviewed today: CLINICAL DATA:  Abnormal SPECT  EXAM: Cardiac/Coronary CTA  TECHNIQUE: The patient was scanned on a [employer]. A 100 kV prospective scan was triggered in the descending thoracic aorta at 111 HU's. Axial non-contrast 3 mm slices were carried out through the heart. The data set was analyzed on a dedicated work station and scored using the [employer]. Gantry rotation speed was 250 msecs and collimation was .6 mm. No beta blockade and 0.8 mg of sl NTG was given. The 3D data set was reconstructed in 5% intervals of the 35-75 % of the R-R  cycle. Diastolic phases were analyzed on a dedicated work station using MPR, MIP and VRT modes. The patient received 80 cc of contrast.  FINDINGS: Image quality: excellent.  Noise artifact is: Limited.  Coronary Arteries:  Normal coronary origin.  Right dominance.  Left main: The left main is a large caliber vessel with a normal take off from the left coronary cusp that bifurcates to form a left anterior descending artery and a left circumflex artery. There is minimal calcified plaque (<25%).  Left anterior descending artery: The proximal LAD contains mild calcified plaque (25-49%). The mid LAD contains heavily calcified circumferential plaque concerning for a moderate stenosis (50-69%). The distal LAD contains minimal mixed density plaque (<25%). The LAD gives off 1 large diagonal branch with minimal mixed density plaque (<25%).  Left circumflex artery: The LCX is non-dominant. There is minimal calcified plaque (<25%) in the proximal segment. The mid and distal segments are patent. The LCX gives off 1 patent obtuse marginal branch.  Right coronary artery: The RCA is dominant with normal take off from the right coronary cusp. There is mild cardiac motion artifact in the  mid RCA that is easily cleared through late systolic phase assessment. The proximal and mid RCA segments contain minimal calcified plaque (<25%). The distal RCA is patent. The RCA terminates as a PDA and right posterolateral branch without evidence of plaque or stenosis.  Right Atrium: Right atrial size is within normal limits.  Right Ventricle: The right ventricular cavity is within normal limits.  Left Atrium: Left atrial size is normal in size with no left atrial appendage filling defect.  Left Ventricle: The ventricular cavity size is within normal limits. There are no stigmata of prior infarction. There is no abnormal filling defect.  Pulmonary arteries: Normal in size without proximal filling defect.  Pulmonary veins: Normal pulmonary venous drainage.  Pericardium: Normal thickness with no significant effusion or calcium present.  Cardiac valves: The aortic valve is trileaflet without significant calcification. The mitral valve is normal structure without significant calcification.  Aorta: Normal caliber with no significant disease.  Extra-cardiac findings: See attached radiology report for non-cardiac structures.  IMPRESSION: 1. Coronary calcium score of 1156. This was 93rd percentile for age and sex matched control.  2. Normal coronary origin with right dominance.  3. Moderate mid LAD calcified plaque that is circumferential (50-69%).  4. Minimal plaque (<25%) in the LCX/RCA.  RECOMMENDATIONS: 1. Moderate stenosis in the mid LAD (50-69%). CT FFR will be sent. No significant ischemia seen in the LAD distribution on recent SPECT. No signs of significant plaque to explain the inferior wall defect seen on SPECT.  Eleonore Chiquito, MD   Electronically Signed   By: Eleonore Chiquito   On: 12/13/2019 12:55    Recent Labs: No results found for requested labs within last 8760 hours.  Recent Lipid Panel    Component Value Date/Time   CHOL  126 10/04/2019 0943   TRIG 128 10/04/2019 0943   HDL 38 (L) 10/04/2019 0943   CHOLHDL 3.3 10/04/2019 0943   LDLCALC 65 10/04/2019 0943    Physical Exam:    VS:  BP (!) 164/86   Pulse 82   Ht 6' (1.829 m)   Wt 219 lb 9.6 oz (99.6 kg)   SpO2 97%   BMI 29.78 kg/m     Wt Readings from Last 3 Encounters:  12/26/20 219 lb 9.6 oz (99.6 kg)  11/11/20 212 lb 6.4 oz (96.3 kg)  11/06/20 214 lb (97.1 kg)  GEN: Patient is in no acute distress HEENT: Normal NECK: No JVD; No carotid bruits LYMPHATICS: No lymphadenopathy CARDIAC: Hear sounds regular, 2/6 systolic murmur at the apex. RESPIRATORY:  Clear to auscultation without rales, wheezing or rhonchi  ABDOMEN: Soft, non-tender, non-distended MUSCULOSKELETAL:  No edema; No deformity  SKIN: Warm and dry NEUROLOGIC:  Alert and oriented x 3 PSYCHIATRIC:  Normal affect   Signed, Jenean Lindau, MD  12/26/2020 1:42 PM    [facility]

## 2020-12-26 NOTE — Patient Instructions (Addendum)
Medication Instructions:  Your physician has recommended you make the following change in your medication:   Increase Clonidine to 0.2 mg two times daily. Start Vascepa  *If you need a refill on your cardiac medications before your next appointment, please call your pharmacy*   Lab Work: Your physician recommends that you have labs done in the office today. Your test included  basic metabolic panel, complete blood count and TSH.  If you have labs (blood work) drawn today and your tests are completely normal, you will receive your results only by: Marland Kitchen MyChart Message (if you have MyChart) OR . A paper copy in the mail If you have any lab test that is abnormal or we need to change your treatment, we will call you to review the results.   Testing/Procedures: Your physician has requested that you have a lexiscan myoview. For further information please visit [email or web address]. Please follow instruction sheet, as given.  The test will take approximately 3 to 4 hours to complete; you may bring reading material.  If someone comes with you to your appointment, they will need to remain in the main lobby due to limited space in the testing area. **If you are pregnant or breastfeeding, please notify the nuclear lab prior to your appointment**  How to prepare for your Myocardial Perfusion Test: . Do not eat or drink 3 hours prior to your test, except you may have water. . Do not consume products containing caffeine (regular or decaffeinated) 12 hours prior to your test. (ex: coffee, chocolate, sodas, tea). Do bring a list of your current medications with you.  If not listed below, you may take your medications as normal. Do not take labetolol (Trandate) for 24 hours prior to the test.  Bring the medication to your appointment as you may be required to take it once the test is complete. . Do wear comfortable clothes (no dresses or overalls) and walking shoes, tennis shoes preferred (No heels or open  toe shoes are allowed). . Do NOT wear cologne, perfume, aftershave, or lotions (deodorant is allowed). . If these instructions are not followed, your test will have to be rescheduled.  WHY IS MY DOCTOR PRESCRIBING ZIO? The Zio system is proven and trusted by physicians to detect and diagnose irregular heart rhythms -- and has been prescribed to hundreds of thousands of patients.  The FDA has cleared the Zio system to monitor for many different kinds of irregular heart rhythms. In a study, physicians were able to reach a diagnosis 90% of the time with the Zio system1.  You can wear the Zio monitor -- a small, discreet, comfortable patch -- during your normal day-to-day activity, including while you sleep, shower, and exercise, while it records every single heartbeat for analysis.  1Barrett, P., et al. Comparison of 24 Hour Holter Monitoring Versus 14 Day Novel Adhesive Patch Electrocardiographic Monitoring. [place], 2014.  ZIO VS. HOLTER MONITORING The Zio monitor can be comfortably worn for up to 14 days. Holter monitors can be worn for 24 to 48 hours, limiting the time to record any irregular heart rhythms you may have. Zio is able to capture data for the 51% of patients who have their first symptom-triggered arrhythmia after 48 hours.1  LIVE WITHOUT RESTRICTIONS The Zio ambulatory cardiac monitor is a small, unobtrusive, and water-resistant patch--you might even forget you're wearing it. The Zio monitor records and stores every beat of your heart, whether you're sleeping, working out, or showering. Wear the monitor for 14  days, remove 01/09/21.   Follow-Up: At [facility], you and your health needs are our priority.  As part of our continuing mission to provide you with exceptional heart care, we have created designated Provider Care Teams.  These Care Teams include your primary Cardiologist (physician) and Advanced Practice Providers (APPs -  Physician Assistants and  Nurse Practitioners) who all work together to provide you with the care you need, when you need it.  We recommend signing up for the patient portal called "MyChart".  Sign up information is provided on this After Visit Summary.  MyChart is used to connect with patients for Virtual Visits (Telemedicine).  Patients are able to view lab/test results, encounter notes, upcoming appointments, etc.  Non-urgent messages can be sent to your provider as well.   To learn more about what you can do with MyChart, go to [email or web address].    Your next appointment:   3 month(s)  The format for your next appointment:   In Person  Provider:   Jyl Heinz, MD   Other Instructions Cardiac Nuclear Scan A cardiac nuclear scan is a test that is done to check the flow of blood to your heart. It is done when you are resting and when you are exercising. The test looks for problems such as:  Not enough blood reaching a portion of the heart.  The heart muscle not working as it should. You may need this test if:  You have heart disease.  You have had lab results that are not normal.  You have had heart surgery or a balloon procedure to open up blocked arteries (angioplasty).  You have chest pain.  You have shortness of breath. In this test, a special dye (tracer) is put into your bloodstream. The tracer will travel to your heart. A camera will then take pictures of your heart to see how the tracer moves through your heart. This test is usually done at a hospital and takes 2-4 hours. Tell a doctor about:  Any allergies you have.  All medicines you are taking, including vitamins, herbs, eye drops, creams, and over-the-counter medicines.  Any problems you or family members have had with anesthetic medicines.  Any blood disorders you have.  Any surgeries you have had.  Any medical conditions you have.  Whether you are pregnant or may be pregnant. What are the risks? Generally, this is a  safe test. However, problems may occur, such as:  Serious chest pain and heart attack. This is only a risk if the stress portion of the test is done.  Rapid heartbeat.  A feeling of warmth in your chest. This feeling usually does not last long.  Allergic reaction to the tracer. What happens before the test?  Ask your doctor about changing or stopping your normal medicines. This is important.  Follow instructions from your doctor about what you cannot eat or drink.  Remove your jewelry on the day of the test. What happens during the test? 1. An IV tube will be inserted into one of your veins. 2. Your doctor will give you a small amount of tracer through the IV tube. 3. You will wait for 20-40 minutes while the tracer moves through your bloodstream. 4. Your heart will be monitored with an electrocardiogram (ECG). 5. You will lie down on an exam table. 6. Pictures of your heart will be taken for about 15-20 minutes. 7. You may also have a stress test. For this test, one of these things may be done: ?  You will be asked to exercise on a treadmill or a stationary bike. ? You will be given medicines that will make your heart work harder. This is done if you are unable to exercise. 8. When blood flow to your heart has peaked, a tracer will again be given through the IV tube. 9. After 20-40 minutes, you will get back on the exam table. More pictures will be taken of your heart. 10. Depending on the tracer that is used, more pictures may need to be taken 3-4 hours later. 11. Your IV tube will be removed when the test is over. The test may vary among doctors and hospitals. What happens after the test? 1. Ask your doctor: ? Whether you can return to your normal schedule, including diet, activities, and medicines. ? Whether you should drink more fluids. This will help to remove the tracer from your body. Drink enough fluid to keep your pee (urine) pale yellow. 2. Ask your doctor, or the  department that is doing the test: ? When will my results be ready? ? How will I get my results? Summary  A cardiac nuclear scan is a test that is done to check the flow of blood to your heart.  Tell your doctor whether you are pregnant or may be pregnant.  Before the test, ask your doctor about changing or stopping your normal medicines. This is important.  Ask your doctor whether you can return to your normal activities. You may be asked to drink more fluids. This information is not intended to replace advice given to you by your health care provider. Make sure you discuss any questions you have with your health care provider. Document Revised: 11/08/2018 Document Reviewed: 01/02/2018 Elsevier Patient Education  2021 [place] ethyl capsules What is this medicine? ICOSAPENT ETHYL (eye KOE sa pent eth il) contains essential fats. It is used to treat high triglyceride levels. Diet and lifestyle changes are often used with this drug. This medicine may be used for other purposes; ask your health care provider or pharmacist if you have questions. COMMON BRAND NAME(S): VASCEPA What should I tell my health care provider before I take this medicine? They need to know if you have any of these conditions:  bleeding disorders  liver disease  an unusual or allergic reaction to icosapent ethyl, fish, shellfish, other medicines, foods, dyes, or preservatives  history of irregular heartbeat  pregnant or trying to get pregnant  breast-feeding How should I use this medicine? Take this medicine by mouth with a glass of water. Follow the directions on the prescription label. Take this medicine with food. Do not cut, crush, dissolve, or chew this medicine. Take your medicine at regular intervals. Do not take it more often than directed. Do not stop taking except on your doctor's advice. Talk to your pediatrician regarding the use of this medicine in children. Special care may be  needed. Overdosage: If you think you have taken too much of this medicine contact a poison control center or emergency room at once. NOTE: This medicine is only for you. Do not share this medicine with others. What if I miss a dose? If you miss a dose, take it as soon as you can. If it is almost time for your next dose, take only that dose. Do not take double or extra doses. What may interact with this medicine? This medicine may interact with the following medications:  aspirin and aspirin-like medicines  beta-blockers like metoprolol and propranolol  certain  medicines that treat or prevent blood clots like warfarin, enoxaparin, dalteparin, apixaban, dabigatran, and rivaroxaban  diuretics  male hormones, like estrogens and birth control pills This list may not describe all possible interactions. Give your health care provider a list of all the medicines, herbs, non-prescription drugs, or dietary supplements you use. Also tell them if you smoke, drink alcohol, or use illegal drugs. Some items may interact with your medicine. What should I watch for while using this medicine? You may need blood work done while you are taking this medicine. Follow a good diet and exercise plan. Taking this medicine does not replace a healthy lifestyle. Some foods that have omega-3 fatty acids naturally are fatty fish like albacore tuna, halibut, herring, mackerel, lake trout, salmon, and sardines. If you are scheduled for any medical or dental procedure, tell your healthcare provider that you are taking this medicine. You may need to stop taking this medicine before the procedure. What side effects may I notice from receiving this medicine? Side effects that you should report to your doctor or health care professional as soon as possible:  allergic reactions like skin rash, itching or hives, swelling of the face, lips, or tongue  breathing problems  unusual bleeding or bruising  fast, irregular  heartbeat Side effects that usually do not require medical attention (report to your doctor or health care professional if they continue or are bothersome):  muscle or joint pain  sore throat  swelling of the ankles, feet, hands  constipation  Gout This list may not describe all possible side effects. Call your doctor for medical advice about side effects. You may report side effects to FDA at 1-800-FDA-1088. Where should I keep my medicine? Keep out of the reach of children. Store at room temperature between 15 and 30 degrees C (59 and 86 degrees F). Throw away any unused medicine after the expiration date. NOTE: This sheet is a summary. It may not cover all possible information. If you have questions about this medicine, talk to your doctor, pharmacist, or health care provider.  2021 Elsevier/Gold Standard (2018-07-18 18:35:46)

## 2020-12-26 NOTE — Addendum Note (Signed)
Addended by: Truddie Hidden on: 12/26/2020 02:39 PM   Modules accepted: Orders

## 2020-12-27 LAB — BASIC METABOLIC PANEL
BUN/Creatinine Ratio: 12 (ref 10–24)
BUN: 14 mg/dL (ref 8–27)
CO2: 21 mmol/L (ref 20–29)
Calcium: 9.4 mg/dL (ref 8.6–10.2)
Chloride: 93 mmol/L — ABNORMAL LOW (ref 96–106)
Creatinine, Ser: 1.21 mg/dL (ref 0.76–1.27)
Glucose: 272 mg/dL — ABNORMAL HIGH (ref 65–99)
Potassium: 4.2 mmol/L (ref 3.5–5.2)
Sodium: 136 mmol/L (ref 134–144)
eGFR: 66 mL/min/{1.73_m2} (ref >59)

## 2020-12-27 LAB — CBC WITH DIFFERENTIAL/PLATELET
Basophils Absolute: 0.1 10*3/uL (ref 0.0–0.2)
Basos: 1 %
EOS (ABSOLUTE): 0.3 10*3/uL (ref 0.0–0.4)
Eos: 5 %
Hematocrit: 38.8 % (ref 37.5–51.0)
Hemoglobin: 12.9 g/dL — ABNORMAL LOW (ref 13.0–17.7)
Immature Grans (Abs): 0 10*3/uL (ref 0.0–0.1)
Immature Granulocytes: 1 %
Lymphocytes Absolute: 1.1 10*3/uL (ref 0.7–3.1)
Lymphs: 19 %
MCH: 30.9 pg (ref 26.6–33.0)
MCHC: 33.2 g/dL (ref 31.5–35.7)
MCV: 93 fL (ref 79–97)
Monocytes Absolute: 0.6 10*3/uL (ref 0.1–0.9)
Monocytes: 11 %
Neutrophils Absolute: 3.6 10*3/uL (ref 1.4–7.0)
Neutrophils: 63 %
Platelets: 128 10*3/uL — ABNORMAL LOW (ref 150–450)
RBC: 4.18 x10E6/uL (ref 4.14–5.80)
RDW: 13.5 % (ref 11.6–15.4)
WBC: 5.6 10*3/uL (ref 3.4–10.8)

## 2020-12-27 LAB — TSH: TSH: 3.7 u[IU]/mL (ref 0.450–4.500)

## 2020-12-30 ENCOUNTER — Encounter: Payer: Self-pay | Admitting: *Deleted

## 2020-12-30 ENCOUNTER — Telehealth: Payer: Self-pay | Admitting: *Deleted

## 2020-12-30 NOTE — Addendum Note (Signed)
Addended by: Jyl Heinz R on: 12/30/2020 09:16 AM   Modules accepted: Orders

## 2020-12-30 NOTE — Telephone Encounter (Signed)
Patient given detailed instructions per Myocardial Perfusion Study Information Sheet for the test on 01/01/21 at 0815. Patient notified to arrive 15 minutes early and that it is imperative to arrive on time for appointment to keep from having the test rescheduled.  If you need to cancel or reschedule your appointment, please call the office within 24 hours of your appointment. . Patient verbalized understanding.[place] Mychart letter sent

## 2021-01-01 ENCOUNTER — Other Ambulatory Visit: Payer: Self-pay

## 2021-01-01 ENCOUNTER — Ambulatory Visit (INDEPENDENT_AMBULATORY_CARE_PROVIDER_SITE_OTHER): Payer: Medicare Other

## 2021-01-01 DIAGNOSIS — I10 Essential (primary) hypertension: Secondary | ICD-10-CM

## 2021-01-01 DIAGNOSIS — I2510 Atherosclerotic heart disease of native coronary artery without angina pectoris: Secondary | ICD-10-CM

## 2021-01-01 DIAGNOSIS — N183 Chronic kidney disease, stage 3 unspecified: Secondary | ICD-10-CM

## 2021-01-01 DIAGNOSIS — I129 Hypertensive chronic kidney disease with stage 1 through stage 4 chronic kidney disease, or unspecified chronic kidney disease: Secondary | ICD-10-CM | POA: Diagnosis not present

## 2021-01-01 DIAGNOSIS — E1122 Type 2 diabetes mellitus with diabetic chronic kidney disease: Secondary | ICD-10-CM

## 2021-01-01 LAB — MYOCARDIAL PERFUSION IMAGING
Estimated workload: 7 METS
Exercise duration (min): 6 min
Exercise duration (sec): 0 s
LV dias vol: 124 mL (ref 62–150)
LV sys vol: 53 mL
MPHR: 154 {beats}/min
Peak HR: 150 {beats}/min
Percent HR: 97 %
Rest HR: 71 {beats}/min
SDS: 4
SRS: 4
SSS: 8
TID: 0.94

## 2021-01-01 MED ORDER — TECHNETIUM TC 99M TETROFOSMIN IV KIT
30.4000 | PACK | Freq: Once | INTRAVENOUS | Status: AC | PRN
  Administered 2021-01-01: 30.4 via INTRAVENOUS

## 2021-01-01 MED ORDER — TECHNETIUM TC 99M TETROFOSMIN IV KIT
10.1000 | PACK | Freq: Once | INTRAVENOUS | Status: AC | PRN
  Administered 2021-01-01: 10.1 via INTRAVENOUS

## 2021-01-08 ENCOUNTER — Other Ambulatory Visit: Payer: Self-pay

## 2021-01-08 ENCOUNTER — Ambulatory Visit (INDEPENDENT_AMBULATORY_CARE_PROVIDER_SITE_OTHER): Payer: Medicare Other | Admitting: Cardiology

## 2021-01-08 ENCOUNTER — Encounter: Payer: Self-pay | Admitting: Cardiology

## 2021-01-08 VITALS — BP 154/78 | HR 69 | Ht 72.0 in | Wt 214.6 lb

## 2021-01-08 DIAGNOSIS — I10 Essential (primary) hypertension: Secondary | ICD-10-CM

## 2021-01-08 DIAGNOSIS — E782 Mixed hyperlipidemia: Secondary | ICD-10-CM

## 2021-01-08 DIAGNOSIS — I129 Hypertensive chronic kidney disease with stage 1 through stage 4 chronic kidney disease, or unspecified chronic kidney disease: Secondary | ICD-10-CM

## 2021-01-08 DIAGNOSIS — I2510 Atherosclerotic heart disease of native coronary artery without angina pectoris: Secondary | ICD-10-CM | POA: Diagnosis not present

## 2021-01-08 DIAGNOSIS — E088 Diabetes mellitus due to underlying condition with unspecified complications: Secondary | ICD-10-CM

## 2021-01-08 DIAGNOSIS — E1122 Type 2 diabetes mellitus with diabetic chronic kidney disease: Secondary | ICD-10-CM

## 2021-01-08 DIAGNOSIS — N183 Chronic kidney disease, stage 3 unspecified: Secondary | ICD-10-CM

## 2021-01-08 MED ORDER — ISOSORBIDE MONONITRATE ER 30 MG PO TB24: 30.0000 mg | ORAL_TABLET | Freq: Every day | ORAL | 3 refills | Status: DC

## 2021-01-08 NOTE — Progress Notes (Signed)
Cardiology Office Note:    Date:  01/08/2021   ID:  [name], DOB 1955/10/08, MRN [id number]  PCP:  Renaldo Reel, PA  Cardiologist:  Jenean Lindau, MD   Referring MD: Renaldo Reel, PA    ASSESSMENT:    1. Coronary artery disease involving native coronary artery of native heart without angina pectoris   2. Essential hypertension   3. Mixed hyperlipidemia   4. 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 Diabetes mellitus due to underlying condition with unspecified complications (David Wu)    PLAN:    In order of problems listed above:  Coronary artery disease: Secondary prevention stressed with the patient.  Importance of compliance with diet medication stressed any vocalized understanding.  I reviewed stress test report with him at extensive length.  It is a low risk and and therefore I have advised him to get back to cardiac rehab.  I have asked him to eat well and take his medications on the morning of the rehab.  This situation will be monitored very carefully by the team and cardiac rehab.  Because of some issues of shortness of breath and mild ischemia is a possibility I have asked him to take isosorbide mononitrate 30 mg daily. Essential hypertension: Blood pressure stable and a forementioned intervention was done.  Diet and salt intake issues and lifestyle modification was urged. Mixed dyslipidemia: Diet was emphasized.  He has not been taking Vascepa and we will try to see if we can do any paperwork to get for him at a cheaper price.  He promises to comply. Patient will be seen in follow-up appointment in 4 months or earlier if the patient has any concerns    Medication Adjustments/Labs and Tests Ordered: Current medicines are reviewed at length with the patient today.  Concerns regarding medicines are outlined above.  No orders of the defined types were placed in this encounter.  No orders of the defined types were  placed in this encounter.    No chief complaint on file.    History of Present Illness:    David Wu is a 65 y.o. male.  Patient has past medical history of coronary artery disease diagnosed by CT coronary angiography, essential hypertension, dyslipidemia and diabetes mellitus.  He denies any problems at this time except some shortness of breath on exertion.  No chest pain orthopnea or PND.  He is CT coronary angiography was largely unremarkable and his stress test also revealed a low risk scan.  Patient mentions to me that he has some shortness of breath issues when he exerts.  No chest pain.  His cardiac rehab was paused because of low blood pressure on 1 occasion.  At the time of my evaluation, the patient is alert awake oriented and in no distress.  Past Medical History:  Diagnosis Date   Abnormal nuclear stress test 11/20/2019   Bleeding ulcer    BMI 27.0-27.9,adult    CAD (coronary artery disease) 11/20/2019   Chronic fatigue 04/22/2015   Diabetes ([place])    Diabetes mellitus due to underlying condition with unspecified complications (David Wu) 27/01/2375   Essential hypertension 04/22/2015   Hearing loss    Hyperlipemia    Hyperlipidemia    Hypertension, essential, benign    Kidney stones    Mixed dyslipidemia 07/20/2019   Nonspecific abnormal electrocardiogram (ECG) (EKG) 07/20/2019   Shortness of breath 04/22/2015   Skin cancer    Stomach  ulcer    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   aspirin EC 81 MG tablet Take 81 mg by mouth daily. Swallow whole.   atorvastatin (LIPITOR) 40 MG tablet Take 40 mg by mouth  daily.   cloNIDine (CATAPRES) 0.2 MG tablet Take 1 tablet (0.2 mg total) by mouth 2 (two) times daily.   Coenzyme Q10 (CO Q-10) 100 MG CHEW Chew 100 mg by mouth daily.   Cyanocobalamin 2000 MCG TBCR Take 2,000 mcg by mouth once a week.   glimepiride (AMARYL) 1 MG tablet Take 1 mg by mouth 2 (two) times daily.   hydrochlorothiazide (HYDRODIURIL) 25 MG tablet Take 25 mg by mouth daily.   labetalol (NORMODYNE) 200 MG tablet Take 400 mg by mouth 2 (two) times daily.   metFORMIN (GLUCOPHAGE) 500 MG tablet Take 500 mg by mouth 2 (two) times daily with a meal.   nitroGLYCERIN (NITROSTAT) 0.4 MG SL tablet Place 0.4 mg under the tongue every 5 (five) minutes as needed for chest pain.   omeprazole (PRILOSEC) 40 MG capsule Take 40 mg by mouth daily.   pioglitazone (ACTOS) 45 MG tablet Take 45 mg by mouth daily.   potassium chloride SA (KLOR-CON) 20 MEQ tablet Take 20 mEq by mouth 2 (two) times daily.   ranolazine (RANEXA) 1000 MG SR tablet Take 1 tablet (1,000 mg total) by mouth 2 (two) times daily.   tamsulosin (FLOMAX) 0.4 MG CAPS capsule Take 0.4 mg by mouth daily.   Vitamin D, Ergocalciferol, (DRISDOL) 1.25 MG (50000 UNIT) CAPS capsule Take 1 capsule by mouth once a week.     Allergies:   Patient has no known allergies.   Social History   Socioeconomic History   Marital status: Single    Spouse name: Not on file   Number of children: Not on file   Years of education: Not on file   Highest education level: Not on file  Occupational History   Not on file  Tobacco Use   Smoking status: Never   Smokeless tobacco: Never  Substance and Sexual Activity   Alcohol use: Not Currently   Drug use: Never   Sexual activity: Not on file  Other Topics Concern   Not on file  Social History Narrative   Not on file   Social Determinants of Health   Financial Resource Strain: Not on file  Food Insecurity: Not on file  Transportation Needs: Not on file  Physical Activity: Not on file  Stress: Not  on file  Social Connections: Not on file     Family History: The patient's family history includes Alzheimer's disease in his mother; Celiac disease in his brother; Diabetes in his father; Hypertension in his father; Stroke in his father.  ROS:   Please see the history of present illness.    All other systems reviewed and are negative.  EKGs/Labs/Other Studies Reviewed:    The following studies were reviewed today: Study Highlights  The left ventricular ejection fraction is normal (55-65%). Nuclear stress EF: 57%. There was no ST segment deviation noted during stress. No T wave inversion was noted during stress. Defect 1: There  is a partially reversible medium-sized defect of moderate severity present in the basal inferior and mid inferior location. There is hypokinesis of the basal inferior wall Good exercise capacity, 97% of MPHR achieving 7 METS. There was hypertensive response to exercise. Findings consistent with prior myocardial infarction with small area of peri-infarct ischemia. This is a low risk study.     Recent Labs: 12/26/2020: BUN 14; Creatinine, Ser 1.21; Hemoglobin 12.9; Platelets 128; Potassium 4.2; Sodium 136; TSH 3.700  Recent Lipid Panel    Component Value Date/Time   CHOL 126 10/04/2019 0943   TRIG 128 10/04/2019 0943   HDL 38 (L) 10/04/2019 0943   CHOLHDL 3.3 10/04/2019 0943   LDLCALC 65 10/04/2019 0943    Physical Exam:    VS:  BP (!) 154/78   Pulse 69   Ht 6' (1.829 m)   Wt 214 lb 9.6 oz (97.3 kg)   SpO2 98%   BMI 29.10 kg/m     Wt Readings from Last 3 Encounters:  01/08/21 214 lb 9.6 oz (97.3 kg)  01/01/21 219 lb (99.3 kg)  12/26/20 219 lb 9.6 oz (99.6 kg)     GEN: Patient is in no acute distress HEENT: Normal NECK: No JVD; No carotid bruits LYMPHATICS: No lymphadenopathy CARDIAC: Hear sounds regular, 2/6 systolic murmur at the apex. RESPIRATORY:  Clear to auscultation without rales, wheezing or rhonchi  ABDOMEN: Soft, non-tender,  non-distended MUSCULOSKELETAL:  No edema; No deformity  SKIN: Warm and dry NEUROLOGIC:  Alert and oriented x 3 PSYCHIATRIC:  Normal affect   Signed, Jenean Lindau, MD  01/08/2021 3:06 PM    [facility]

## 2021-01-08 NOTE — Patient Instructions (Signed)
Medication Instructions:  Your physician has recommended you make the following change in your medication:   Start Imdur (isosorbide) 30 mg daily.  *If you need a refill on your cardiac medications before your next appointment, please call your pharmacy*   Lab Work: None ordered If you have labs (blood work) drawn today and your tests are completely normal, you will receive your results only by: [place] (if you have MyChart) OR A paper copy in the mail If you have any lab test that is abnormal or we need to change your treatment, we will call you to review the results.   Testing/Procedures: None ordered   Follow-Up: At [facility], you and your health needs are our priority.  As part of our continuing mission to provide you with exceptional heart care, we have created designated Provider Care Teams.  These Care Teams include your primary Cardiologist (physician) and Advanced Practice Providers (APPs -  Physician Assistants and Nurse Practitioners) who all work together to provide you with the care you need, when you need it.  We recommend signing up for the patient portal called "MyChart".  Sign up information is provided on this After Visit Summary.  MyChart is used to connect with patients for Virtual Visits (Telemedicine).  Patients are able to view lab/test results, encounter notes, upcoming appointments, etc.  Non-urgent messages can be sent to your provider as well.   To learn more about what you can do with MyChart, go to [email or web address].    Your next appointment:   1 month(s)  The format for your next appointment:   In Person  Provider:   Jyl Heinz, MD   Other Instructions Isosorbide Mononitrate extended-release tablets What is this medicine? ISOSORBIDE MONONITRATE (eye soe SOR bide mon oh NYE trate) is a vasodilator. It relaxes blood vessels, increasing the blood and oxygen supply to your heart. This medicine is used to prevent chest pain caused  by angina. It will not help to stop an episode of chest pain. This medicine may be used for other purposes; ask your health care provider or pharmacist if you have questions. COMMON BRAND NAME(S): Imdur, Isotrate ER What should I tell my health care provider before I take this medicine? They need to know if you have any of these conditions: previous heart attack or heart failure an unusual or allergic reaction to isosorbide mononitrate, nitrates, other medicines, foods, dyes, or preservatives pregnant or trying to get pregnant breast-feeding How should I use this medicine? Take this medicine by mouth with a glass of water. Follow the directions on the prescription label. Do not crush or chew. Take your medicine at regular intervals. Do not take your medicine more often than directed. Do not stop taking this medicine except on the advice of your doctor or health care professional. Talk to your pediatrician regarding the use of this medicine in children. Special care may be needed. Overdosage: If you think you have taken too much of this medicine contact a poison control center or emergency room at once. NOTE: This medicine is only for you. Do not share this medicine with others. What if I miss a dose? If you miss a dose, take it as soon as you can. If it is almost time for your next dose, take only that dose. Do not take double or extra doses. What may interact with this medicine? Do not take this medicine with any of the following medications: medicines used to treat erectile dysfunction (ED) like avanafil, sildenafil, tadalafil,  and vardenafil riociguat This medicine may also interact with the following medications: medicines for high blood pressure other medicines for angina or heart failure This list may not describe all possible interactions. Give your health care provider a list of all the medicines, herbs, non-prescription drugs, or dietary supplements you use. Also tell them if you  smoke, drink alcohol, or use illegal drugs. Some items may interact with your medicine. What should I watch for while using this medicine? Check your heart rate and blood pressure regularly while you are taking this medicine. Ask your doctor or health care professional what your heart rate and blood pressure should be and when you should contact him or her. Tell your doctor or health care professional if you feel your medicine is no longer working. You may get dizzy. Do not drive, use machinery, or do anything that needs mental alertness until you know how this medicine affects you. To reduce the risk of dizzy or fainting spells, do not sit or stand up quickly, especially if you are an older patient. Alcohol can make you more dizzy, and increase flushing and rapid heartbeats. Avoid alcoholic drinks. Do not treat yourself for coughs, colds, or pain while you are taking this medicine without asking your doctor or health care professional for advice. Some ingredients may increase your blood pressure. What side effects may I notice from receiving this medicine? Side effects that you should report to your doctor or health care professional as soon as possible: bluish discoloration of lips, fingernails, or palms of hands irregular heartbeat, palpitations low blood pressure nausea, vomiting persistent headache unusually weak or tired Side effects that usually do not require medical attention (report to your doctor or health care professional if they continue or are bothersome): flushing of the face or neck rash This list may not describe all possible side effects. Call your doctor for medical advice about side effects. You may report side effects to FDA at 1-800-FDA-1088. Where should I keep my medicine? Keep out of the reach of children. Store between 15 and 30 degrees C (59 and 86 degrees F). Keep container tightly closed. Throw away any unused medicine after the expiration date. NOTE: This sheet is a  summary. It may not cover all possible information. If you have questions about this medicine, talk to your doctor, pharmacist, or health care provider.  2021 Elsevier/Gold Standard (2013-05-18 14:48:19)

## 2021-02-20 ENCOUNTER — Ambulatory Visit: Payer: Medicare Other | Admitting: Cardiology

## 2021-03-30 ENCOUNTER — Ambulatory Visit: Payer: Medicare Other | Admitting: Cardiology

## 2021-11-20 ENCOUNTER — Other Ambulatory Visit: Payer: Self-pay | Admitting: Urology

## 2021-11-30 DIAGNOSIS — C61 Malignant neoplasm of prostate: Secondary | ICD-10-CM

## 2021-11-30 HISTORY — DX: Malignant neoplasm of prostate: C61

## 2021-12-09 NOTE — Progress Notes (Addendum)
Anesthesia Review: ? ?PCP: DR amy Moon in [place]  ?Cardiologist : DR Abran Richard- LOV 09/18/21  ?Chest x-ray : ?EKG :01/09/21  ?Echo : 2021  ?Monitor- 01/11/21  ?Ct Card- 2021  ?Stress test:01/01/21  ?Cardiac Cath :  ?Activity level: can do a flight of stairs without difficulty  ?Sleep Study/ CPAP : none  ?Fasting Blood Sugar :      / Checks Blood Sugar -- times a day:   ?Blood Thinner/ Instructions /Last Dose: ?ASA / Instructions/ Last Dose :   ?DM- type 2 checks glucose several times a day with Freestyle Libre  ?Hgba1c- 12/14/21- 6.9  ?BMP done 12/14/21- routed to DR [facility].  ?

## 2021-12-09 NOTE — Progress Notes (Signed)
DUE TO COVID-19 ONLY  2  VISITOR IS ALLOWED TO COME WITH YOU AND STAY IN THE WAITING ROOM ONLY DURING PRE OP AND PROCEDURE DAY OF SURGERY.   4 VISITOR  MAY VISIT WITH YOU AFTER SURGERY IN YOUR PRIVATE ROOM DURING VISITING HOURS ONLY! ?YOU MAY HAVE ONE PERSON SPEND THE NITE WITH YOU IN YOUR ROOM AFTER SURGERY.   ? ? Your procedure is scheduled on:  ?  12/25/21  ? Report to [facility] Main  Entrance ? ? Report to admitting at   0515             AM ?DO NOT [place], PICTURE ID OR WALLET DAY OF SURGERY.  ?  ? ? Call this number if you have problems the morning of surgery [phone]  ? ?Clear liquid diet the day before surgery.   ?At 4pm the day before surgery mix 255 g of Miralax with 64 ounces of Gatorade and drink one glass every 15-30 minutes until completed.   ? ? REMEMBER: NO  SOLID FOODS , CANDY, GUM OR MINTS AFTER MIDNITE THE NITE BEFORE SURGERY .       [place] CLEAR LIQUIDS UNTIL     0430am            DAY OF SURGERY.    .   ? ? ? ? ?CLEAR LIQUID DIET ? ? ?Foods Allowed      ?WATER ?BLACK COFFEE ( SUGAR OK, NO MILK, CREAM OR CREAMER) REGULAR AND DECAF  ?TEA ( SUGAR OK NO MILK, CREAM, OR CREAMER) REGULAR AND DECAF  ?PLAIN JELLO ( NO RED)  ?FRUIT ICES ( NO RED, NO FRUIT PULP)  ?POPSICLES ( NO RED)  ?JUICE- APPLE, WHITE GRAPE AND WHITE CRANBERRY  ?SPORT DRINK LIKE GATORADE ( NO RED)  ?CLEAR BROTH ( VEGETABLE , CHICKEN OR BEEF)                                                               ? ?    ? ?BRUSH YOUR TEETH MORNING OF SURGERY AND RINSE YOUR MOUTH OUT, NO CHEWING GUM CANDY OR MINTS. ?  ? ? Take these medicines the morning of surgery with A SIP OF WATER:  clonidine, ?Labetalol, flomax  ? ?  ? ? ?DO NOT TAKE ANY DIABETIC MEDICATIONS DAY OF YOUR SURGERY ?                  ?            You may not have any metal on your body including hair pins and  ?            piercings  Do not wear jewelry, make-up, lotions, powders or perfumes, deodorant ?            Do not wear nail polish on your  fingernails.   ?           IF YOU ARE A MALE AND WANT TO SHAVE UNDER ARMS OR LEGS PRIOR TO SURGERY YOU MUST DO SO AT LEAST 48 HOURS PRIOR TO SURGERY.  ?            Men may shave face and neck. ? ? Do not bring valuables to the hospital. [facility] NOT ?  RESPONSIBLE   FOR VALUABLES. ? Contacts, dentures or bridgework may not be worn into surgery. ? Leave suitcase in the car. After surgery it may be brought to your room. ? ?  ? Patients discharged the day of surgery will not be allowed to drive home. IF YOU ARE HAVING SURGERY AND GOING HOME THE SAME DAY, YOU MUST HAVE AN ADULT TO DRIVE YOU HOME AND BE WITH YOU FOR 24 HOURS. YOU MAY GO HOME BY TAXI OR UBER OR ORTHERWISE, BUT AN ADULT MUST ACCOMPANY YOU HOME AND STAY WITH YOU FOR 24 HOURS. ?  ? ?            Please read over the following fact sheets you were given: ?_____________________________________________________________________ ? ?[place] - Preparing for Surgery ?Before surgery, you can play an important role.  Because skin is not sterile, your skin needs to be as free of germs as possible.  You can reduce the number of germs on your skin by washing with CHG (chlorahexidine gluconate) soap before surgery.  CHG is an antiseptic cleaner which kills germs and bonds with the skin to continue killing germs even after washing. ?Please DO NOT use if you have an allergy to CHG or antibacterial soaps.  If your skin becomes reddened/irritated stop using the CHG and inform your nurse when you arrive at Short Stay. ?Do not shave (including legs and underarms) for at least 48 hours prior to the first CHG shower.  You may shave your face/neck. ?Please follow these instructions carefully: ? 1.  Shower with CHG Soap the night before surgery and the  morning of Surgery. ? 2.  If you choose to wash your hair, wash your hair first as usual with your  normal  shampoo. ? 3.  After you shampoo, rinse your hair and body thoroughly to remove the  shampoo.                            4.  Use CHG as you would any other liquid soap.  You can apply chg directly  to the skin and wash  ?                     Gently with a scrungie or clean washcloth. ? 5.  Apply the CHG Soap to your body ONLY FROM THE NECK DOWN.   Do not use on face/ open      ?                     Wound or open sores. Avoid contact with eyes, ears mouth and genitals (private parts).  ?                     Production manager,  Genitals (private parts) with your normal soap. ?            6.  Wash thoroughly, paying special attention to the area where your surgery  will be performed. ? 7.  Thoroughly rinse your body with warm water from the neck down. ? 8.  DO NOT shower/wash with your normal soap after using and rinsing off  the CHG Soap. ?               9.  Pat yourself dry with a clean towel. ?           10.  Wear clean pajamas. ?  11.  Place clean sheets on your bed the night of your first shower and do not  sleep with pets. ?Day of Surgery : ?Do not apply any lotions/deodorants the morning of surgery.  Please wear clean clothes to the hospital/surgery center. ? ?FAILURE TO FOLLOW THESE INSTRUCTIONS MAY RESULT IN THE CANCELLATION OF YOUR SURGERY ?PATIENT SIGNATURE_________________________________ ? ?NURSE SIGNATURE__________________________________ ? ?________________________________________________________________________  ? ? ?           ?

## 2021-12-14 ENCOUNTER — Other Ambulatory Visit: Payer: Self-pay

## 2021-12-14 ENCOUNTER — Encounter (HOSPITAL_COMMUNITY): Payer: Self-pay

## 2021-12-14 ENCOUNTER — Encounter (HOSPITAL_COMMUNITY)
Admission: RE | Admit: 2021-12-14 | Discharge: 2021-12-14 | Disposition: A | Discharge status: Home / Self Care | Payer: Medicare Other | Source: Ambulatory Visit | Attending: Urology | Admitting: Urology

## 2021-12-14 VITALS — BP 126/64 | HR 61 | Temp 98.1°F | Resp 16 | Ht 72.0 in | Wt 213.0 lb

## 2021-12-14 DIAGNOSIS — E139 Other specified diabetes mellitus without complications: Secondary | ICD-10-CM

## 2021-12-14 DIAGNOSIS — Z01812 Encounter for preprocedural laboratory examination: Secondary | ICD-10-CM | POA: Diagnosis present

## 2021-12-14 DIAGNOSIS — Z01818 Encounter for other preprocedural examination: Secondary | ICD-10-CM

## 2021-12-14 HISTORY — DX: Personal history of peptic ulcer disease: Z87.11

## 2021-12-14 HISTORY — DX: Personal history of urinary calculi: Z87.442

## 2021-12-14 LAB — CBC
HCT: 36.6 % — ABNORMAL LOW (ref 39.0–52.0)
Hemoglobin: 11.9 g/dL — ABNORMAL LOW (ref 13.0–17.0)
MCH: 30.6 pg (ref 26.0–34.0)
MCHC: 32.5 g/dL (ref 30.0–36.0)
MCV: 94.1 fL (ref 80.0–100.0)
Platelets: 131 10*3/uL — ABNORMAL LOW (ref 150–400)
RBC: 3.89 MIL/uL — ABNORMAL LOW (ref 4.22–5.81)
RDW: 14.6 % (ref 11.5–15.5)
WBC: 3.9 10*3/uL — ABNORMAL LOW (ref 4.0–10.5)
nRBC: 0 % (ref 0.0–0.2)

## 2021-12-14 LAB — BASIC METABOLIC PANEL
Anion gap: 7 (ref 5–15)
BUN: 29 mg/dL — ABNORMAL HIGH (ref 8–23)
CO2: 25 mmol/L (ref 22–32)
Calcium: 9.3 mg/dL (ref 8.9–10.3)
Chloride: 103 mmol/L (ref 98–111)
Creatinine, Ser: 1.57 mg/dL — ABNORMAL HIGH (ref 0.61–1.24)
GFR, Estimated: 48 mL/min — ABNORMAL LOW (ref >60)
Glucose, Bld: 190 mg/dL — ABNORMAL HIGH (ref 70–99)
Potassium: 4.1 mmol/L (ref 3.5–5.1)
Sodium: 135 mmol/L (ref 135–145)

## 2021-12-14 LAB — HEMOGLOBIN A1C
Hgb A1c MFr Bld: 6.9 % — ABNORMAL HIGH (ref 4.8–5.6)
Mean Plasma Glucose: 151.33 mg/dL

## 2021-12-14 LAB — GLUCOSE, CAPILLARY: Glucose-Capillary: 184 mg/dL — ABNORMAL HIGH (ref 70–99)

## 2021-12-24 NOTE — Anesthesia Preprocedure Evaluation (Addendum)
Anesthesia Evaluation  Patient identified by MRN, date of birth, ID band Patient awake    Reviewed: Allergy & Precautions, NPO status , Patient's Chart, lab work & pertinent test results  Airway Mallampati: I  TM Distance: >3 FB Neck ROM: Full    Dental no notable dental hx.    Pulmonary shortness of breath,    Pulmonary exam normal breath sounds clear to auscultation       Cardiovascular Exercise Tolerance: Good METS: 5 - 7 Mets hypertension, Pt. on medications and Pt. on home beta blockers + CAD  Normal cardiovascular exam Rhythm:Regular Rate:Normal     Neuro/Psych negative neurological ROS  negative psych ROS   GI/Hepatic Neg liver ROS, PUD,   Endo/Other  negative endocrine ROSdiabetes, Type 2, Oral Hypoglycemic Agents  Renal/GU Renal Insufficiency and CRFRenal disease  negative genitourinary   Musculoskeletal negative musculoskeletal ROS (+)   Abdominal   Peds negative pediatric ROS (+)  Hematology negative hematology ROS (+)   Anesthesia Other Findings   Reproductive/Obstetrics negative OB ROS                            Anesthesia Physical Anesthesia Plan  ASA: 3  Anesthesia Plan: General   Post-op Pain Management:    Induction: Intravenous  PONV Risk Score and Plan: 2 and Treatment may vary due to age or medical condition, Ondansetron and Dexamethasone  Airway Management Planned: Oral ETT  Additional Equipment: None  Intra-op Plan:   Post-operative Plan: Extubation in OR  Informed Consent: I have reviewed the patients History and Physical, chart, labs and discussed the procedure including the risks, benefits and alternatives for the proposed anesthesia with the patient or authorized representative who has indicated his/her understanding and acceptance.     Dental advisory given  Plan Discussed with: CRNA, Anesthesiologist and Surgeon  Anesthesia Plan Comments:          Anesthesia Quick Evaluation

## 2021-12-25 ENCOUNTER — Encounter (HOSPITAL_COMMUNITY): Payer: Self-pay | Admitting: Urology

## 2021-12-25 ENCOUNTER — Ambulatory Visit (HOSPITAL_COMMUNITY): Payer: Medicare Other | Admitting: Physician Assistant

## 2021-12-25 ENCOUNTER — Ambulatory Visit (HOSPITAL_BASED_OUTPATIENT_CLINIC_OR_DEPARTMENT_OTHER): Payer: Medicare Other | Admitting: Registered Nurse

## 2021-12-25 ENCOUNTER — Other Ambulatory Visit: Payer: Self-pay

## 2021-12-25 ENCOUNTER — Inpatient Hospital Stay (HOSPITAL_COMMUNITY)
Admission: RE | Admit: 2021-12-25 | Discharge: 2021-12-29 | DRG: 707 | Disposition: A | Discharge status: Home / Self Care | Payer: Medicare Other | Attending: Urology | Admitting: Urology

## 2021-12-25 ENCOUNTER — Encounter (HOSPITAL_COMMUNITY)
Admission: RE | Disposition: A | Discharge status: Home / Self Care | Payer: Self-pay | Source: Home / Self Care | Attending: Urology

## 2021-12-25 DIAGNOSIS — I2510 Atherosclerotic heart disease of native coronary artery without angina pectoris: Secondary | ICD-10-CM

## 2021-12-25 DIAGNOSIS — Z85828 Personal history of other malignant neoplasm of skin: Secondary | ICD-10-CM

## 2021-12-25 DIAGNOSIS — E1122 Type 2 diabetes mellitus with diabetic chronic kidney disease: Secondary | ICD-10-CM | POA: Diagnosis present

## 2021-12-25 DIAGNOSIS — I129 Hypertensive chronic kidney disease with stage 1 through stage 4 chronic kidney disease, or unspecified chronic kidney disease: Secondary | ICD-10-CM | POA: Diagnosis present

## 2021-12-25 DIAGNOSIS — Z823 Family history of stroke: Secondary | ICD-10-CM

## 2021-12-25 DIAGNOSIS — E782 Mixed hyperlipidemia: Secondary | ICD-10-CM | POA: Diagnosis present

## 2021-12-25 DIAGNOSIS — Z833 Family history of diabetes mellitus: Secondary | ICD-10-CM

## 2021-12-25 DIAGNOSIS — E785 Hyperlipidemia, unspecified: Secondary | ICD-10-CM | POA: Diagnosis present

## 2021-12-25 DIAGNOSIS — Z7982 Long term (current) use of aspirin: Secondary | ICD-10-CM

## 2021-12-25 DIAGNOSIS — Z7984 Long term (current) use of oral hypoglycemic drugs: Secondary | ICD-10-CM

## 2021-12-25 DIAGNOSIS — C61 Malignant neoplasm of prostate: Secondary | ICD-10-CM | POA: Diagnosis not present

## 2021-12-25 DIAGNOSIS — N179 Acute kidney failure, unspecified: Secondary | ICD-10-CM | POA: Diagnosis not present

## 2021-12-25 DIAGNOSIS — Z79899 Other long term (current) drug therapy: Secondary | ICD-10-CM

## 2021-12-25 DIAGNOSIS — Z01818 Encounter for other preprocedural examination: Secondary | ICD-10-CM

## 2021-12-25 DIAGNOSIS — Z8249 Family history of ischemic heart disease and other diseases of the circulatory system: Secondary | ICD-10-CM

## 2021-12-25 DIAGNOSIS — N183 Chronic kidney disease, stage 3 unspecified: Secondary | ICD-10-CM | POA: Diagnosis present

## 2021-12-25 DIAGNOSIS — D62 Acute posthemorrhagic anemia: Secondary | ICD-10-CM | POA: Diagnosis not present

## 2021-12-25 DIAGNOSIS — Z8352 Family history of ear disorders: Secondary | ICD-10-CM

## 2021-12-25 DIAGNOSIS — Z820 Family history of epilepsy and other diseases of the nervous system: Secondary | ICD-10-CM

## 2021-12-25 HISTORY — PX: LYMPH NODE DISSECTION: SHX5087

## 2021-12-25 HISTORY — PX: ROBOT ASSISTED LAPAROSCOPIC RADICAL PROSTATECTOMY: SHX5141

## 2021-12-25 LAB — GLUCOSE, CAPILLARY
Glucose-Capillary: 193 mg/dL — ABNORMAL HIGH (ref 70–99)
Glucose-Capillary: 205 mg/dL — ABNORMAL HIGH (ref 70–99)
Glucose-Capillary: 287 mg/dL — ABNORMAL HIGH (ref 70–99)
Glucose-Capillary: 299 mg/dL — ABNORMAL HIGH (ref 70–99)
Glucose-Capillary: 266 mg/dL — ABNORMAL HIGH (ref 70–99)

## 2021-12-25 LAB — BASIC METABOLIC PANEL
Anion gap: 8 (ref 5–15)
BUN: 22 mg/dL (ref 8–23)
CO2: 23 mmol/L (ref 22–32)
Calcium: 9.3 mg/dL (ref 8.9–10.3)
Chloride: 106 mmol/L (ref 98–111)
Creatinine, Ser: 1.44 mg/dL — ABNORMAL HIGH (ref 0.61–1.24)
GFR, Estimated: 53 mL/min — ABNORMAL LOW (ref >60)
Glucose, Bld: 230 mg/dL — ABNORMAL HIGH (ref 70–99)
Potassium: 3.6 mmol/L (ref 3.5–5.1)
Sodium: 137 mmol/L (ref 135–145)

## 2021-12-25 LAB — ABO/RH: ABO/RH(D): O POS

## 2021-12-25 LAB — HEMOGLOBIN AND HEMATOCRIT, BLOOD
HCT: 35.1 % — ABNORMAL LOW (ref 39.0–52.0)
HCT: 29 % — ABNORMAL LOW (ref 39.0–52.0)
Hemoglobin: 11.6 g/dL — ABNORMAL LOW (ref 13.0–17.0)
Hemoglobin: 9.7 g/dL — ABNORMAL LOW (ref 13.0–17.0)

## 2021-12-25 SURGERY — PROSTATECTOMY, RADICAL, ROBOT-ASSISTED, LAPAROSCOPIC
Anesthesia: General

## 2021-12-25 MED ORDER — FENTANYL CITRATE (PF) 100 MCG/2ML IJ SOLN
INTRAMUSCULAR | Status: AC
  Filled 2021-12-25: qty 2

## 2021-12-25 MED ORDER — INSULIN ASPART 100 UNIT/ML IJ SOLN
0.0000 [IU] | Freq: Three times a day (TID) | INTRAMUSCULAR | Status: DC
  Administered 2021-12-25: 8 [IU] via SUBCUTANEOUS
  Administered 2021-12-26: 5 [IU] via SUBCUTANEOUS
  Administered 2021-12-26 (×2): 3 [IU] via SUBCUTANEOUS
  Administered 2021-12-27: 5 [IU] via SUBCUTANEOUS
  Administered 2021-12-27 (×2): 3 [IU] via SUBCUTANEOUS
  Administered 2021-12-28 (×2): 5 [IU] via SUBCUTANEOUS
  Administered 2021-12-28: 8 [IU] via SUBCUTANEOUS
  Administered 2021-12-29: 5 [IU] via SUBCUTANEOUS
  Administered 2021-12-29 (×2): 8 [IU] via SUBCUTANEOUS

## 2021-12-25 MED ORDER — LACTATED RINGERS IV SOLN: INTRAVENOUS | Status: DC

## 2021-12-25 MED ORDER — FENTANYL CITRATE (PF) 100 MCG/2ML IJ SOLN
INTRAMUSCULAR | Status: DC | PRN
  Administered 2021-12-25: 25 ug via INTRAVENOUS
  Administered 2021-12-25: 100 ug via INTRAVENOUS
  Administered 2021-12-25 (×3): 50 ug via INTRAVENOUS

## 2021-12-25 MED ORDER — OXYBUTYNIN CHLORIDE 5 MG PO TABS: 5.0000 mg | ORAL_TABLET | Freq: Three times a day (TID) | ORAL | Status: DC | PRN

## 2021-12-25 MED ORDER — AMLODIPINE BESYLATE 10 MG PO TABS
10.0000 mg | ORAL_TABLET | Freq: Every day | ORAL | Status: DC
  Administered 2021-12-26 – 2021-12-29 (×4): 10 mg via ORAL
  Filled 2021-12-25 (×4): qty 1

## 2021-12-25 MED ORDER — ONDANSETRON HCL 4 MG/2ML IJ SOLN
INTRAMUSCULAR | Status: AC
  Filled 2021-12-25: qty 2

## 2021-12-25 MED ORDER — SODIUM CHLORIDE 0.9% FLUSH
3.0000 mL | INTRAVENOUS | Status: DC | PRN
  Administered 2021-12-27: 3 mL via INTRAVENOUS

## 2021-12-25 MED ORDER — SULFAMETHOXAZOLE-TRIMETHOPRIM 800-160 MG PO TABS: 1.0000 | ORAL_TABLET | Freq: Two times a day (BID) | ORAL | 0 refills | Status: DC

## 2021-12-25 MED ORDER — ONDANSETRON HCL 4 MG/2ML IJ SOLN
4.0000 mg | INTRAMUSCULAR | Status: DC | PRN
  Administered 2021-12-25 – 2021-12-26 (×2): 4 mg via INTRAVENOUS
  Filled 2021-12-25 (×2): qty 2

## 2021-12-25 MED ORDER — SODIUM CHLORIDE 0.9% FLUSH
3.0000 mL | Freq: Two times a day (BID) | INTRAVENOUS | Status: DC
  Administered 2021-12-25 – 2021-12-29 (×7): 3 mL via INTRAVENOUS

## 2021-12-25 MED ORDER — MIDAZOLAM HCL 2 MG/2ML IJ SOLN
INTRAMUSCULAR | Status: AC
  Filled 2021-12-25: qty 2

## 2021-12-25 MED ORDER — CHLORHEXIDINE GLUCONATE 0.12 % MT SOLN
15.0000 mL | Freq: Two times a day (BID) | OROMUCOSAL | Status: DC
  Administered 2021-12-25 – 2021-12-29 (×9): 15 mL via OROMUCOSAL
  Filled 2021-12-25 (×9): qty 15

## 2021-12-25 MED ORDER — ONDANSETRON HCL 4 MG/2ML IJ SOLN
INTRAMUSCULAR | Status: DC | PRN
  Administered 2021-12-25: 4 mg via INTRAVENOUS

## 2021-12-25 MED ORDER — ROCURONIUM BROMIDE 10 MG/ML (PF) SYRINGE
PREFILLED_SYRINGE | INTRAVENOUS | Status: AC
  Filled 2021-12-25: qty 10

## 2021-12-25 MED ORDER — DEXAMETHASONE SODIUM PHOSPHATE 10 MG/ML IJ SOLN
INTRAMUSCULAR | Status: AC
  Filled 2021-12-25: qty 1

## 2021-12-25 MED ORDER — DOCUSATE SODIUM 100 MG PO CAPS
100.0000 mg | ORAL_CAPSULE | Freq: Two times a day (BID) | ORAL | Status: DC
  Administered 2021-12-25 – 2021-12-29 (×8): 100 mg via ORAL
  Filled 2021-12-25 (×8): qty 1

## 2021-12-25 MED ORDER — LABETALOL HCL 200 MG PO TABS
400.0000 mg | ORAL_TABLET | Freq: Two times a day (BID) | ORAL | Status: DC
  Administered 2021-12-25 – 2021-12-29 (×8): 400 mg via ORAL
  Filled 2021-12-25 (×8): qty 2

## 2021-12-25 MED ORDER — ACETAMINOPHEN 500 MG PO TABS
1000.0000 mg | ORAL_TABLET | Freq: Once | ORAL | Status: AC
  Administered 2021-12-25: 1000 mg via ORAL
  Filled 2021-12-25: qty 2

## 2021-12-25 MED ORDER — LACTATED RINGERS IV SOLN: INTRAVENOUS | Status: DC | PRN

## 2021-12-25 MED ORDER — SODIUM CHLORIDE 0.45 % IV SOLN: INTRAVENOUS | Status: DC

## 2021-12-25 MED ORDER — SENNOSIDES-DOCUSATE SODIUM 8.6-50 MG PO TABS: 1.0000 | ORAL_TABLET | Freq: Two times a day (BID) | ORAL | 0 refills | Status: DC

## 2021-12-25 MED ORDER — SODIUM CHLORIDE (PF) 0.9 % IJ SOLN
INTRAMUSCULAR | Status: DC | PRN
  Administered 2021-12-25: 10 mL

## 2021-12-25 MED ORDER — CHLORHEXIDINE GLUCONATE 0.12 % MT SOLN
15.0000 mL | Freq: Once | OROMUCOSAL | Status: AC
  Administered 2021-12-25: 15 mL via OROMUCOSAL

## 2021-12-25 MED ORDER — ONDANSETRON HCL 4 MG/2ML IJ SOLN: 4.0000 mg | Freq: Once | INTRAMUSCULAR | Status: DC | PRN

## 2021-12-25 MED ORDER — ORAL CARE MOUTH RINSE
15.0000 mL | Freq: Two times a day (BID) | OROMUCOSAL | Status: DC
  Administered 2021-12-25 – 2021-12-29 (×7): 15 mL via OROMUCOSAL

## 2021-12-25 MED ORDER — LACTATED RINGERS IR SOLN
Status: DC | PRN
  Administered 2021-12-25: 1000 mL

## 2021-12-25 MED ORDER — LIDOCAINE 2% (20 MG/ML) 5 ML SYRINGE
INTRAMUSCULAR | Status: DC | PRN
  Administered 2021-12-25: 80 mg via INTRAVENOUS

## 2021-12-25 MED ORDER — DEXAMETHASONE SODIUM PHOSPHATE 10 MG/ML IJ SOLN
INTRAMUSCULAR | Status: DC | PRN
  Administered 2021-12-25: 8 mg via INTRAVENOUS

## 2021-12-25 MED ORDER — ORAL CARE MOUTH RINSE: 15.0000 mL | Freq: Once | OROMUCOSAL | Status: AC

## 2021-12-25 MED ORDER — STERILE WATER FOR INJECTION IJ SOLN
INTRAMUSCULAR | Status: DC | PRN
  Administered 2021-12-25: 10 mL via INTRAMUSCULAR

## 2021-12-25 MED ORDER — CEFAZOLIN SODIUM-DEXTROSE 2-4 GM/100ML-% IV SOLN
2.0000 g | INTRAVENOUS | Status: AC
  Administered 2021-12-25: 2 g via INTRAVENOUS
  Filled 2021-12-25: qty 100

## 2021-12-25 MED ORDER — INSULIN ASPART 100 UNIT/ML IJ SOLN
0.0000 [IU] | Freq: Every day | INTRAMUSCULAR | Status: DC
  Administered 2021-12-25: 3 [IU] via SUBCUTANEOUS
  Administered 2021-12-27: 2 [IU] via SUBCUTANEOUS

## 2021-12-25 MED ORDER — GLYCOPYRROLATE PF 0.2 MG/ML IJ SOSY
PREFILLED_SYRINGE | INTRAMUSCULAR | Status: DC | PRN
  Administered 2021-12-25: .2 mg via INTRAVENOUS

## 2021-12-25 MED ORDER — OXYCODONE-ACETAMINOPHEN 5-325 MG PO TABS: 1.0000 | ORAL_TABLET | Freq: Four times a day (QID) | ORAL | 0 refills | Status: AC | PRN

## 2021-12-25 MED ORDER — MIDAZOLAM HCL 5 MG/5ML IJ SOLN
INTRAMUSCULAR | Status: DC | PRN
  Administered 2021-12-25: 2 mg via INTRAVENOUS

## 2021-12-25 MED ORDER — AMISULPRIDE (ANTIEMETIC) 5 MG/2ML IV SOLN: 10.0000 mg | Freq: Once | INTRAVENOUS | Status: DC | PRN

## 2021-12-25 MED ORDER — FENTANYL CITRATE PF 50 MCG/ML IJ SOSY
PREFILLED_SYRINGE | INTRAMUSCULAR | Status: AC
  Filled 2021-12-25: qty 1

## 2021-12-25 MED ORDER — ATORVASTATIN CALCIUM 40 MG PO TABS
40.0000 mg | ORAL_TABLET | Freq: Every day | ORAL | Status: DC
  Administered 2021-12-26 – 2021-12-29 (×4): 40 mg via ORAL
  Filled 2021-12-25 (×4): qty 1

## 2021-12-25 MED ORDER — GLYCOPYRROLATE 0.2 MG/ML IJ SOLN
INTRAMUSCULAR | Status: AC
  Filled 2021-12-25: qty 1

## 2021-12-25 MED ORDER — PHENYLEPHRINE HCL-NACL 20-0.9 MG/250ML-% IV SOLN
INTRAVENOUS | Status: AC
  Filled 2021-12-25: qty 250

## 2021-12-25 MED ORDER — OXYCODONE HCL 5 MG/5ML PO SOLN: 5.0000 mg | Freq: Once | ORAL | Status: DC | PRN

## 2021-12-25 MED ORDER — BUPIVACAINE LIPOSOME 1.3 % IJ SUSP
INTRAMUSCULAR | Status: DC | PRN
  Administered 2021-12-25: 20 mL

## 2021-12-25 MED ORDER — BUPIVACAINE LIPOSOME 1.3 % IJ SUSP
INTRAMUSCULAR | Status: AC
  Filled 2021-12-25: qty 20

## 2021-12-25 MED ORDER — HYDROCHLOROTHIAZIDE 25 MG PO TABS
25.0000 mg | ORAL_TABLET | Freq: Every day | ORAL | Status: DC
  Administered 2021-12-26 – 2021-12-29 (×4): 25 mg via ORAL
  Filled 2021-12-25 (×4): qty 1

## 2021-12-25 MED ORDER — ACETAMINOPHEN 10 MG/ML IV SOLN
1000.0000 mg | Freq: Four times a day (QID) | INTRAVENOUS | Status: AC
  Administered 2021-12-25 – 2021-12-26 (×4): 1000 mg via INTRAVENOUS
  Filled 2021-12-25 (×4): qty 100

## 2021-12-25 MED ORDER — PROPOFOL 10 MG/ML IV BOLUS
INTRAVENOUS | Status: AC
  Filled 2021-12-25: qty 20

## 2021-12-25 MED ORDER — BENAZEPRIL HCL 10 MG PO TABS: 40.0000 mg | ORAL_TABLET | Freq: Every day | ORAL | Status: DC

## 2021-12-25 MED ORDER — SENNA 8.6 MG PO TABS
1.0000 | ORAL_TABLET | Freq: Two times a day (BID) | ORAL | Status: DC
  Administered 2021-12-25 – 2021-12-29 (×8): 8.6 mg via ORAL
  Filled 2021-12-25 (×8): qty 1

## 2021-12-25 MED ORDER — SODIUM CHLORIDE 0.9 % IV SOLN: 250.0000 mL | INTRAVENOUS | Status: DC | PRN

## 2021-12-25 MED ORDER — TRIPLE ANTIBIOTIC 3.5-400-5000 EX OINT: 1.0000 "application " | TOPICAL_OINTMENT | Freq: Three times a day (TID) | CUTANEOUS | Status: DC | PRN

## 2021-12-25 MED ORDER — PROPOFOL 10 MG/ML IV BOLUS
INTRAVENOUS | Status: DC | PRN
  Administered 2021-12-25: 150 mg via INTRAVENOUS

## 2021-12-25 MED ORDER — FENTANYL CITRATE PF 50 MCG/ML IJ SOSY
25.0000 ug | PREFILLED_SYRINGE | INTRAMUSCULAR | Status: DC | PRN
  Administered 2021-12-25: 25 ug via INTRAVENOUS

## 2021-12-25 MED ORDER — SODIUM CHLORIDE 0.9 % IV BOLUS
500.0000 mL | Freq: Once | INTRAVENOUS | Status: AC
  Administered 2021-12-25: 500 mL via INTRAVENOUS

## 2021-12-25 MED ORDER — POLYETHYLENE GLYCOL 3350 17 GM/SCOOP PO POWD: 1.0000 | Freq: Once | ORAL | Status: DC

## 2021-12-25 MED ORDER — SUGAMMADEX SODIUM 200 MG/2ML IV SOLN
INTRAVENOUS | Status: DC | PRN
  Administered 2021-12-25: 200 mg via INTRAVENOUS

## 2021-12-25 MED ORDER — LIDOCAINE HCL (PF) 2 % IJ SOLN
INTRAMUSCULAR | Status: AC
  Filled 2021-12-25: qty 5

## 2021-12-25 MED ORDER — ROCURONIUM BROMIDE 10 MG/ML (PF) SYRINGE
PREFILLED_SYRINGE | INTRAVENOUS | Status: DC | PRN
  Administered 2021-12-25: 80 mg via INTRAVENOUS
  Administered 2021-12-25: 10 mg via INTRAVENOUS
  Administered 2021-12-25: 20 mg via INTRAVENOUS

## 2021-12-25 MED ORDER — SODIUM CHLORIDE (PF) 0.9 % IJ SOLN
INTRAMUSCULAR | Status: AC
  Filled 2021-12-25: qty 20

## 2021-12-25 MED ORDER — OXYCODONE HCL 5 MG PO TABS: 5.0000 mg | ORAL_TABLET | Freq: Once | ORAL | Status: DC | PRN

## 2021-12-25 MED ORDER — HYDROMORPHONE HCL 1 MG/ML IJ SOLN
0.5000 mg | INTRAMUSCULAR | Status: DC | PRN
  Administered 2021-12-25: 1 mg via INTRAVENOUS
  Administered 2021-12-25: 0.5 mg via INTRAVENOUS
  Administered 2021-12-26 (×4): 1 mg via INTRAVENOUS
  Filled 2021-12-25 (×6): qty 1

## 2021-12-25 MED ORDER — OXYCODONE HCL 5 MG PO TABS
5.0000 mg | ORAL_TABLET | ORAL | Status: DC | PRN
  Administered 2021-12-26 – 2021-12-27 (×6): 5 mg via ORAL
  Filled 2021-12-25 (×6): qty 1

## 2021-12-25 SURGICAL SUPPLY — 79 items
ADH SKN CLS APL DERMABOND .7 (GAUZE/BANDAGES/DRESSINGS) ×2
APL PRP STRL LF DISP 70% ISPRP (MISCELLANEOUS) ×2
APL SWBSTK 6 STRL LF DISP (MISCELLANEOUS) ×2
APPLICATOR COTTON TIP 6 STRL (MISCELLANEOUS) ×3 IMPLANT
APPLICATOR COTTON TIP 6IN STRL (MISCELLANEOUS) ×3
BAG COUNTER SPONGE SURGICOUNT (BAG) IMPLANT
BAG RETRIEVAL 10 (BASKET) ×1
BAG SPNG CNTER NS LX DISP (BAG)
CATH FOLEY 2WAY SLVR 18FR 30CC (CATHETERS) ×4 IMPLANT
CATH TIEMANN FOLEY 18FR 5CC (CATHETERS) ×4 IMPLANT
CHLORAPREP W/TINT 26 (MISCELLANEOUS) ×4 IMPLANT
CLIP LIGATING HEM O LOK PURPLE (MISCELLANEOUS) ×16 IMPLANT
CNTNR URN SCR LID CUP LEK RST (MISCELLANEOUS) ×3 IMPLANT
CONT SPEC 4OZ STRL OR WHT (MISCELLANEOUS) ×3
COVER SURGICAL LIGHT HANDLE (MISCELLANEOUS) ×4 IMPLANT
COVER TIP SHEARS 8 DVNC (MISCELLANEOUS) ×3 IMPLANT
COVER TIP SHEARS 8MM DA VINCI (MISCELLANEOUS) ×3
CUTTER ECHEON FLEX ENDO 45 340 (ENDOMECHANICALS) ×4 IMPLANT
DERMABOND ADVANCED (GAUZE/BANDAGES/DRESSINGS) ×1
DERMABOND ADVANCED .7 DNX12 (GAUZE/BANDAGES/DRESSINGS) ×3 IMPLANT
DRAIN CHANNEL RND F F (WOUND CARE) IMPLANT
DRAPE ARM DVNC X/XI (DISPOSABLE) ×12 IMPLANT
DRAPE COLUMN DVNC XI (DISPOSABLE) ×3 IMPLANT
DRAPE DA VINCI XI ARM (DISPOSABLE) ×12
DRAPE DA VINCI XI COLUMN (DISPOSABLE) ×3
DRAPE SURG IRRIG POUCH 19X23 (DRAPES) ×4 IMPLANT
DRSG TEGADERM 4X4.75 (GAUZE/BANDAGES/DRESSINGS) ×4 IMPLANT
ELECT PENCIL ROCKER SW 15FT (MISCELLANEOUS) IMPLANT
ELECT REM PT RETURN 15FT ADLT (MISCELLANEOUS) ×4 IMPLANT
GAUZE 4X4 16PLY ~~LOC~~+RFID DBL (SPONGE) IMPLANT
GAUZE SPONGE 2X2 8PLY STRL LF (GAUZE/BANDAGES/DRESSINGS) IMPLANT
GLOVE BIO SURGEON STRL SZ 6.5 (GLOVE) ×4 IMPLANT
GLOVE BIOGEL PI IND STRL 7.5 (GLOVE) ×3 IMPLANT
GLOVE BIOGEL PI INDICATOR 7.5 (GLOVE) ×1
GLOVE SURG LX 7.5 STRW (GLOVE) ×2
GLOVE SURG LX STRL 7.5 STRW (GLOVE) ×6 IMPLANT
GOWN SRG XL LVL 4 BRTHBL STRL (GOWNS) ×3 IMPLANT
GOWN STRL NON-REIN XL LVL4 (GOWNS) ×3
HOLDER FOLEY CATH W/STRAP (MISCELLANEOUS) ×4 IMPLANT
IRRIG SUCT STRYKERFLOW 2 WTIP (MISCELLANEOUS) ×3
IRRIGATION SUCT STRKRFLW 2 WTP (MISCELLANEOUS) ×3 IMPLANT
IV LACTATED RINGERS 1000ML (IV SOLUTION) ×4 IMPLANT
KIT PROCEDURE DA VINCI SI (MISCELLANEOUS) ×3
KIT PROCEDURE DVNC SI (MISCELLANEOUS) ×3 IMPLANT
KIT TURNOVER KIT A (KITS) IMPLANT
NDL INSUFFLATION 14GA 120MM (NEEDLE) ×3 IMPLANT
NDL SPNL 22GX7 QUINCKE BK (NEEDLE) ×3 IMPLANT
NEEDLE INSUFFLATION 14GA 120MM (NEEDLE) ×3 IMPLANT
NEEDLE SPNL 22GX7 QUINCKE BK (NEEDLE) ×3 IMPLANT
PACK ROBOT UROLOGY CUSTOM (CUSTOM PROCEDURE TRAY) ×4 IMPLANT
PAD POSITIONING PINK XL (MISCELLANEOUS) ×4 IMPLANT
PORT ACCESS TROCAR AIRSEAL 12 (TROCAR) ×3 IMPLANT
PORT ACCESS TROCAR AIRSEAL 5M (TROCAR) ×1
RELOAD STAPLE 45 4.1 GRN THCK (STAPLE) ×3 IMPLANT
SEAL CANN UNIV 5-8 DVNC XI (MISCELLANEOUS) ×12 IMPLANT
SEAL XI 5MM-8MM UNIVERSAL (MISCELLANEOUS) ×12
SEALER SYNCHRO 8 IS4000 DV (MISCELLANEOUS) ×3
SEALER SYNCHRO 8 IS4000 DVNC (MISCELLANEOUS) IMPLANT
SET TRI-LUMEN FLTR TB AIRSEAL (TUBING) ×4 IMPLANT
SOLUTION ELECTROLUBE (MISCELLANEOUS) ×4 IMPLANT
SPIKE FLUID TRANSFER (MISCELLANEOUS) ×4 IMPLANT
SPONGE GAUZE 2X2 STER 10/PKG (GAUZE/BANDAGES/DRESSINGS)
SPONGE T-LAP 4X18 ~~LOC~~+RFID (SPONGE) ×4 IMPLANT
STAPLE RELOAD 45 GRN (STAPLE) ×2 IMPLANT
STAPLE RELOAD 45MM GREEN (STAPLE) ×3
SUT ETHILON 3 0 PS 1 (SUTURE) ×4 IMPLANT
SUT MNCRL AB 4-0 PS2 18 (SUTURE) ×8 IMPLANT
SUT PDS AB 1 CT1 27 (SUTURE) ×8 IMPLANT
SUT VIC AB 2-0 SH 27 (SUTURE) ×3
SUT VIC AB 2-0 SH 27X BRD (SUTURE) ×3 IMPLANT
SUT VICRYL 0 UR6 27IN ABS (SUTURE) ×4 IMPLANT
SUT VLOC BARB 180 ABS3/0GR12 (SUTURE) ×9
SUTURE VLOC BRB 180 ABS3/0GR12 (SUTURE) ×9 IMPLANT
SYR 27GX1/2 1ML LL SAFETY (SYRINGE) ×4 IMPLANT
SYS BAG RETRIEVAL 10MM (BASKET) ×2
SYSTEM BAG RETRIEVAL 10MM (BASKET) IMPLANT
TOWEL OR NON WOVEN STRL DISP B (DISPOSABLE) ×4 IMPLANT
TROCAR Z-THREAD FIOS 5X100MM (TROCAR) IMPLANT
WATER STERILE IRR 1000ML POUR (IV SOLUTION) ×4 IMPLANT

## 2021-12-25 NOTE — Progress Notes (Signed)
  Transition of Care [facility]) Screening Note   Patient Details  Name: David Wu Date of Birth: 08/23/1955   [facility] CM/SW Contact:    Dessa Phi, RN Phone Number: 12/25/2021, 2:22 PM    Transition of Care Department [facility]) has reviewed patient and no TOC needs have been identified at this time. We will continue to monitor patient advancement through interdisciplinary progression rounds. If new patient transition needs arise, please place a TOC consult.

## 2021-12-25 NOTE — Transfer of Care (Signed)
Immediate Anesthesia Transfer of Care Note  Patient: David Wu  Procedure(s) Performed: XI ROBOTIC ASSISTED LAPAROSCOPIC RADICAL PROSTATECTOMY WITH INDOCYANINE GREEN DYE LYMPH NODE DISSECTION (Bilateral)  Patient Location: PACU  Anesthesia Type:General  Level of Consciousness: awake, alert , oriented and patient cooperative  Airway & Oxygen Therapy: Patient Spontanous Breathing and Patient connected to face mask oxygen  Post-op Assessment: Report given to RN, Post -op Vital signs reviewed and stable and Patient moving all extremities  Post vital signs: Reviewed and stable  Last Vitals:  Vitals Value Taken Time  BP 175/85 12/25/21 1105  Temp    Pulse 63 12/25/21 1109  Resp 22 12/25/21 1109  SpO2 99 % 12/25/21 1109  Vitals shown include unvalidated device data.  Last Pain:  Vitals:   12/25/21 0619  TempSrc:   PainSc: 0-No pain         Complications: No notable events documented.

## 2021-12-25 NOTE — Progress Notes (Signed)
NT called RN into room because patient was "shaking" and lost consciousness after being assisted to sit in chair for the first time since surgery.  When RN entered room, patient was non-responsive and very pale.  Patient responded to name after about one minute.  BP 106/53, HR 81.  BP rechecked after five minutes, 136/62.  RR RN and CN at bedside.  Spoke to MD on phone, new order for H & H placed.  EKG performed, showed NSR.  Patient reports feeling "better."  Will continue to monitor.  Angie Fava, RN

## 2021-12-25 NOTE — Progress Notes (Signed)
Chaplain engaged in an initial visit with [name].  Jemery provided education on the [employer] Healthcare POA process.  Chaplain also explained to Brewer and his wife, who he desires to be his healthcare agent, that because they are married there is already a legally binding agreement in place in which medical staff have to refer to Mrs. Cloe if he loses the capacity or ability to speak for himself.  Chaplain let them know they could still complete the paperwork but that their marriage is also a legally binding agreement for the medical team to honor.    Chaplain offered education, support, listening, and presence.     12/25/21 1500  Clinical Encounter Type  Visited With Patient and family together  Visit Type Initial;Social support  Spiritual Encounters  Spiritual Needs Literature;Brochure

## 2021-12-25 NOTE — Progress Notes (Signed)
Patient up to chair concerned that he had a vagal episode.  Alert and oriented x 4 when this writer arrived to room.  See Vital sign flow-sheet.   EKG completed with NSR results. Primary nurse notified Dr. Tammi Klippel orders for H&H with results of 9.2

## 2021-12-25 NOTE — Anesthesia Postprocedure Evaluation (Signed)
Anesthesia Post Note  Patient: David Wu  Procedure(s) Performed: XI ROBOTIC ASSISTED LAPAROSCOPIC RADICAL PROSTATECTOMY WITH INDOCYANINE GREEN DYE LYMPH NODE DISSECTION (Bilateral)     Patient location during evaluation: PACU Anesthesia Type: General Level of consciousness: awake Pain management: pain level controlled Vital Signs Assessment: post-procedure vital signs reviewed and stable Respiratory status: spontaneous breathing and respiratory function stable Cardiovascular status: stable Postop Assessment: no apparent nausea or vomiting Anesthetic complications: no   No notable events documented.  Last Vitals:  Vitals:   12/25/21 1200 12/25/21 1215  BP: (!) 154/64 (!) 150/63  Pulse: 65 61  Resp: (!) 7 10  Temp:    SpO2: 99% 99%    Last Pain:  Vitals:   12/25/21 1200  TempSrc:   PainSc: Asleep                 Merlinda Frederick

## 2021-12-25 NOTE — Brief Op Note (Signed)
12/25/2021  10:51 AM  PATIENT:  David Wu  66 y.o. male  PRE-OPERATIVE DIAGNOSIS:  PROSTATE CANCER  POST-OPERATIVE DIAGNOSIS:  PROSTATE CANCER  PROCEDURE:  Procedure(s): XI ROBOTIC ASSISTED LAPAROSCOPIC RADICAL PROSTATECTOMY WITH INDOCYANINE GREEN DYE (N/A) LYMPH NODE DISSECTION (Bilateral)  SURGEON:  Surgeon(s) and Role:    * Alexis Frock, MD - Primary  PHYSICIAN ASSISTANT:   ASSISTANTS: Davis Gourd MD   ANESTHESIA:   local and general  EBL:  180 mL   BLOOD ADMINISTERED:none  DRAINS:  1 - JP to bulb; 2 - Foley to gravity    LOCAL MEDICATIONS USED:  MARCAINE     SPECIMEN:  Source of Specimen:  prostatectomy; prostate nodes;   DISPOSITION OF SPECIMEN:  PATHOLOGY  COUNTS:  YES  TOURNIQUET:  * No tourniquets in log *  DICTATION: .Other Dictation: Dictation Number [id number]  PLAN OF CARE: Admit for overnight observation  PATIENT DISPOSITION:  PACU - hemodynamically stable.   Delay start of Pharmacological VTE agent (>24hrs) due to surgical blood loss or risk of bleeding: yes

## 2021-12-25 NOTE — Progress Notes (Signed)
Dressing around JP drain insertion site leaking serous fluid since patient arrived on floor.  MD aware.  Dressing changed x2 due to saturation.  Angie Fava, RN

## 2021-12-25 NOTE — Discharge Instructions (Signed)

## 2021-12-25 NOTE — Progress Notes (Signed)
S:  POD 0 s/p robotic prostatectomy / node dissection. Aparant vagal episode on first standing. Now mentating well.  O:   Glucose OK, Hgb immed post-op 11's and stable.  NAD, AOx3, wife at bedsie RRR Non-labored breathing on minimal [place] O2 JP with expected serosanguinous output that is non-foul. Foley with light pink uriene. No c/c/e.  A/P.  Doing acceptable after surgery. Clinical status favors vagal episode, now resolved. Discussed with pt, wife, NSG, precautions with sitting up / standing. Repeat Hgb pending. Discussed propensitiy usually resolve within 24 hours post-op. Will monitor.

## 2021-12-25 NOTE — H&P (Signed)
David Wu is an 66 y.o. male.    Chief Complaint: Pre-OP Prostatectomy / Node dissection  HPI:   1 - Moderate Risk Prostate Cancer - 9/12 cores up to 60% mix of grade 1 and 2 cancer on eval PSA 4.4 by Dr. Nila Nephew 09/2021. CT localized. Prostate Vol 50gm with very small early median on CT.   2 - Lower Urinary Tract Symptoms - on tamsulosin at baseline with good control. PVR 10/2021 90 mL.   PMH sig for CAD (reassuring cath 2022, meds only, follows T. Folk with [facility] cards, on nitro), DM2. 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 His wife Rosann Auerbach is very involved. His PCP is [facility].   Today " [name] " is seen to proceed with prostatectomy / node dissection for primary management of his prostate cancer. NO interval fevers. Cr 1.5, Hgb 11.9.    Past Medical History:  Diagnosis Date   Abnormal nuclear stress test 11/20/2019   Bleeding ulcer    BMI 27.0-27.9,adult    CAD (coronary artery disease) 11/20/2019   Chronic fatigue 04/22/2015   Diabetes ([place])    Diabetes mellitus due to underlying condition with unspecified complications ([place]) 05/26/8526   Essential hypertension 04/22/2015   Hearing loss    History of bleeding ulcers    History of kidney stones    Hyperlipemia    Hyperlipidemia    Hypertension, essential, benign    Kidney stones    Mixed dyslipidemia 07/20/2019   Nonspecific abnormal electrocardiogram (ECG) (EKG) 07/20/2019   Shortness of breath 04/22/2015   Skin cancer    Stomach ulcer    Type 2 diabetes mellitus with stage 3 chronic kidney disease and hypertension ([place])    Type 2 diabetes mellitus without complication ([place]) 78/24/2353    Past Surgical History:  Procedure Laterality Date   CATARACT EXTRACTION Left 2008   CATARACT EXTRACTION Right 2018   left eye surgery      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 He has never used smokeless tobacco. He reports that he does not drink alcohol and does not use drugs.  Allergies: No Known Allergies  Medications Prior to Admission  Medication Sig Dispense Refill   amLODipine-benazepril (LOTREL) 10-40 MG capsule Take 1 capsule by mouth daily.     atorvastatin (LIPITOR) 40 MG tablet Take 40 mg by mouth daily.     cloNIDine (CATAPRES) 0.1 MG tablet Take 0.1 mg by mouth 2 (two) times daily.     Coenzyme Q10 (CO Q-10) 100 MG CHEW Chew 100 mg by mouth daily.     Cyanocobalamin 2000 MCG TBCR Take 2,000 mcg by mouth once a week.     fenofibrate 54 MG tablet Take 54 mg by mouth daily.     glimepiride (AMARYL) 1 MG tablet Take 1 mg by mouth 2 (two) times daily.     hydrochlorothiazide (HYDRODIURIL) 25 MG tablet Take 25 mg by mouth daily.     labetalol (NORMODYNE) 200 MG tablet Take 400 mg by mouth 2 (two) times daily.     metFORMIN (GLUCOPHAGE) 500 MG tablet Take 500 mg by mouth 2 (two) times daily with a meal.     nitroGLYCERIN (NITROSTAT) 0.4 MG SL  tablet Place 0.4 mg under the tongue every 5 (five) minutes as needed for chest pain.     pioglitazone (ACTOS) 45 MG tablet Take 45 mg by mouth daily.     Potassium Chloride ER 20 MEQ TBCR Take 20 mEq by mouth 2 (two) times daily.     tamsulosin (FLOMAX) 0.4 MG CAPS capsule Take 0.4 mg by mouth daily.     aspirin EC 81 MG tablet Take 81 mg by mouth daily. Swallow whole. (Patient not taking: Reported on 12/09/2021)     cloNIDine (CATAPRES) 0.2 MG tablet Take 1 tablet (0.2 mg total) by mouth 2 (two) times daily. (Patient not taking: Reported on 12/09/2021) 180 tablet 3   isosorbide mononitrate (IMDUR) 30 MG 24 hr tablet Take 1 tablet (30 mg total) by mouth daily. (Patient not taking: Reported on 12/09/2021) 90 tablet 3   ranolazine (RANEXA) 1000 MG SR tablet Take 1 tablet (1,000 mg total) by mouth 2 (two) times daily. (Patient not taking:  Reported on 12/09/2021) 90 tablet 3    Results for orders placed or performed during the hospital encounter of 12/25/21 (from the past 48 hour(s))  Glucose, capillary     Status: Abnormal   Collection Time: 12/25/21  5:36 AM  Result Value Ref Range   Glucose-Capillary 193 (H) 70 - 99 mg/dL    Comment: Glucose reference range applies only to samples taken after fasting for at least 8 hours.   No results found.  Review of Systems  Constitutional:  Negative for chills and fever.  All other systems reviewed and are negative.  Blood pressure (!) 150/70, pulse 74, temperature 98.3 F (36.8 C), temperature source Oral, resp. rate 18, SpO2 98 %. Physical Exam Vitals reviewed.  HENT:     Head: Normocephalic.     Mouth/Throat:     Mouth: Mucous membranes are moist.  Eyes:     Pupils: Pupils are equal, round, and reactive to light.  Cardiovascular:     Rate and Rhythm: Normal rate.  Pulmonary:     Effort: Pulmonary effort is normal.  Abdominal:     General: Abdomen is flat.  Genitourinary:    Comments: No CVAT Musculoskeletal:        General: Normal range of motion.     Cervical back: Normal range of motion.  Skin:    General: Skin is warm.  Neurological:     General: No focal deficit present.     Mental Status: He is alert.  Psychiatric:        Mood and Affect: Mood normal.     Assessment/Plan  Proceed as planned with prostatectomy / node dissection for large volume moderate risk disease. Risks, benefits, alternatives, expected peri-op course discussed previously and reiterated today.   Alexis Frock, MD 12/25/2021, 7:01 AM

## 2021-12-25 NOTE — Anesthesia Procedure Notes (Signed)
Procedure Name: Intubation Date/Time: 12/25/2021 7:36 AM Performed by: Victoriano Lain, CRNA Pre-anesthesia Checklist: Patient identified, Emergency Drugs available, Suction available, Patient being monitored and Timeout performed Patient Re-evaluated:Patient Re-evaluated prior to induction Oxygen Delivery Method: Circle system utilized Preoxygenation: Pre-oxygenation with 100% oxygen Induction Type: IV induction Ventilation: Mask ventilation without difficulty Laryngoscope Size: Mac Grade View: Grade I Tube type: Oral Tube size: 7.5 mm Number of attempts: 1 Airway Equipment and Method: Stylet Placement Confirmation: ETT inserted through vocal cords under direct vision, positive ETCO2 and breath sounds checked- equal and bilateral Secured at: 22 cm Tube secured with: Tape Dental Injury: Teeth and Oropharynx as per pre-operative assessment

## 2021-12-26 ENCOUNTER — Observation Stay (HOSPITAL_COMMUNITY): Payer: Medicare Other

## 2021-12-26 ENCOUNTER — Encounter (HOSPITAL_COMMUNITY): Payer: Self-pay | Admitting: Urology

## 2021-12-26 DIAGNOSIS — D62 Acute posthemorrhagic anemia: Secondary | ICD-10-CM | POA: Diagnosis not present

## 2021-12-26 DIAGNOSIS — Z8249 Family history of ischemic heart disease and other diseases of the circulatory system: Secondary | ICD-10-CM | POA: Diagnosis not present

## 2021-12-26 DIAGNOSIS — C61 Malignant neoplasm of prostate: Secondary | ICD-10-CM | POA: Diagnosis present

## 2021-12-26 DIAGNOSIS — Z833 Family history of diabetes mellitus: Secondary | ICD-10-CM | POA: Diagnosis not present

## 2021-12-26 DIAGNOSIS — Z85828 Personal history of other malignant neoplasm of skin: Secondary | ICD-10-CM | POA: Diagnosis not present

## 2021-12-26 DIAGNOSIS — Z8352 Family history of ear disorders: Secondary | ICD-10-CM | POA: Diagnosis not present

## 2021-12-26 DIAGNOSIS — E785 Hyperlipidemia, unspecified: Secondary | ICD-10-CM | POA: Diagnosis present

## 2021-12-26 DIAGNOSIS — E1122 Type 2 diabetes mellitus with diabetic chronic kidney disease: Secondary | ICD-10-CM | POA: Diagnosis present

## 2021-12-26 DIAGNOSIS — I129 Hypertensive chronic kidney disease with stage 1 through stage 4 chronic kidney disease, or unspecified chronic kidney disease: Secondary | ICD-10-CM | POA: Diagnosis present

## 2021-12-26 DIAGNOSIS — E782 Mixed hyperlipidemia: Secondary | ICD-10-CM | POA: Diagnosis present

## 2021-12-26 DIAGNOSIS — Z7982 Long term (current) use of aspirin: Secondary | ICD-10-CM | POA: Diagnosis not present

## 2021-12-26 DIAGNOSIS — Z79899 Other long term (current) drug therapy: Secondary | ICD-10-CM | POA: Diagnosis not present

## 2021-12-26 DIAGNOSIS — N183 Chronic kidney disease, stage 3 unspecified: Secondary | ICD-10-CM | POA: Diagnosis present

## 2021-12-26 DIAGNOSIS — I2510 Atherosclerotic heart disease of native coronary artery without angina pectoris: Secondary | ICD-10-CM | POA: Diagnosis present

## 2021-12-26 DIAGNOSIS — N179 Acute kidney failure, unspecified: Secondary | ICD-10-CM | POA: Diagnosis not present

## 2021-12-26 DIAGNOSIS — Z7984 Long term (current) use of oral hypoglycemic drugs: Secondary | ICD-10-CM | POA: Diagnosis not present

## 2021-12-26 DIAGNOSIS — Z823 Family history of stroke: Secondary | ICD-10-CM | POA: Diagnosis not present

## 2021-12-26 DIAGNOSIS — Z820 Family history of epilepsy and other diseases of the nervous system: Secondary | ICD-10-CM | POA: Diagnosis not present

## 2021-12-26 LAB — BASIC METABOLIC PANEL
Anion gap: 10 (ref 5–15)
BUN: 34 mg/dL — ABNORMAL HIGH (ref 8–23)
CO2: 21 mmol/L — ABNORMAL LOW (ref 22–32)
Calcium: 8.2 mg/dL — ABNORMAL LOW (ref 8.9–10.3)
Chloride: 97 mmol/L — ABNORMAL LOW (ref 98–111)
Creatinine, Ser: 2.86 mg/dL — ABNORMAL HIGH (ref 0.61–1.24)
GFR, Estimated: 23 mL/min — ABNORMAL LOW (ref >60)
Glucose, Bld: 182 mg/dL — ABNORMAL HIGH (ref 70–99)
Potassium: 3.7 mmol/L (ref 3.5–5.1)
Sodium: 128 mmol/L — ABNORMAL LOW (ref 135–145)

## 2021-12-26 LAB — PREPARE RBC (CROSSMATCH)

## 2021-12-26 LAB — CBC
HCT: 23.9 % — ABNORMAL LOW (ref 39.0–52.0)
Hemoglobin: 8.1 g/dL — ABNORMAL LOW (ref 13.0–17.0)
MCH: 30.6 pg (ref 26.0–34.0)
MCHC: 33.9 g/dL (ref 30.0–36.0)
MCV: 90.2 fL (ref 80.0–100.0)
Platelets: 135 10*3/uL — ABNORMAL LOW (ref 150–400)
RBC: 2.65 MIL/uL — ABNORMAL LOW (ref 4.22–5.81)
RDW: 15.4 % (ref 11.5–15.5)
WBC: 7.8 10*3/uL (ref 4.0–10.5)
nRBC: 0 % (ref 0.0–0.2)

## 2021-12-26 LAB — HEMOGLOBIN AND HEMATOCRIT, BLOOD
HCT: 23.1 % — ABNORMAL LOW (ref 39.0–52.0)
HCT: 25.1 % — ABNORMAL LOW (ref 39.0–52.0)
Hemoglobin: 7.9 g/dL — ABNORMAL LOW (ref 13.0–17.0)
Hemoglobin: 8.7 g/dL — ABNORMAL LOW (ref 13.0–17.0)

## 2021-12-26 LAB — GLUCOSE, CAPILLARY
Glucose-Capillary: 195 mg/dL — ABNORMAL HIGH (ref 70–99)
Glucose-Capillary: 193 mg/dL — ABNORMAL HIGH (ref 70–99)
Glucose-Capillary: 202 mg/dL — ABNORMAL HIGH (ref 70–99)
Glucose-Capillary: 158 mg/dL — ABNORMAL HIGH (ref 70–99)

## 2021-12-26 MED ORDER — CHLORHEXIDINE GLUCONATE CLOTH 2 % EX PADS
6.0000 | MEDICATED_PAD | Freq: Every day | CUTANEOUS | Status: DC
  Administered 2021-12-26 – 2021-12-29 (×4): 6 via TOPICAL

## 2021-12-26 MED ORDER — SODIUM CHLORIDE 0.9 % IV SOLN: INTRAVENOUS | Status: DC

## 2021-12-26 MED ORDER — METOCLOPRAMIDE HCL 5 MG PO TABS
5.0000 mg | ORAL_TABLET | Freq: Three times a day (TID) | ORAL | Status: DC | PRN
  Administered 2021-12-26 – 2021-12-27 (×4): 5 mg via ORAL
  Filled 2021-12-26 (×4): qty 1

## 2021-12-26 MED ORDER — SODIUM CHLORIDE 0.9% IV SOLUTION: Freq: Once | INTRAVENOUS | Status: AC

## 2021-12-26 NOTE — Progress Notes (Addendum)
Rn spoke with MD [place]. New orders: flush 20cc into foley, CT Abdomen and Pelvis without contrast.   PT foley was flushed - urine output did not improve.  Abdominal and pelvic distention present.

## 2021-12-26 NOTE — Op Note (Unsigned)
NAME: David Wu, David Wu MEDICAL RECORD NO: [id number] ACCOUNT NO: [id number] DATE OF BIRTH: 1956/01/09 FACILITY: [facility] LOCATION: [id number] PHYSICIAN: Alexis Frock, MD  Operative Report   DATE OF PROCEDURE: 12/25/2021  PREOPERATIVE DIAGNOSIS:  Large volume moderate risk prostate cancer.  PROCEDURES: 1.  Robotic-assisted laparoscopic radical prostatectomy. 2.  Bilateral pelvic lymphadenectomy.  ESTIMATED BLOOD LOSS:  180 mL.  COMPLICATIONS:  None.  SPECIMENS:  1.  Right external iliac lymph nodes, right obturator lymph nodes. 2.  Left external iliac lymph nodes, left obturator lymph nodes. 3.  Perivesical lymph node, sentinel, radical prostatectomy.  ASSISTANT:  Aldine Contes, M.D.  FINDINGS:  Sentinel lymph node noted in the perivesical location in the left, excellent parenchymal uptake of the prostate with ICG.  INDICATIONS:  David Wu is a very pleasant 66 year old man who was found on workup of elevated PSA to have a fairly large volume adenocarcinoma of the prostate.  He does have significant baseline obstructive voiding symptoms.  He has been on medical therapy  for this.  He was referred for consideration of curative intent. Operative therapy with prostatectomy and he was found to be a suitable candidate.  Informed consent was obtained and placed in medical record.  PROCEDURE IN DETAIL:  The patient as being [facility] Mcclain verified, procedure being radical prostatectomy was confirmed and node dissection.  A timeout was performed.  Intravenous antibiotics were administered.  General endotracheal anesthesia  induced.  The patient was placed into a low lithotomy position.  Sterile field was created, prepped and draped the patient's penis, perineum, and proximal thighs using iodine and his infra-xiphoid abdomen using chlorhexidine gluconate after clipper  shaving and after further fastened to operating table using 3-inch tape with foam padding across supraxiphoid chest.  Next,  a high flow, low pressure pneumoperitoneum was obtained using Veress technique in the supraumbilical midline having passed the  aspiration and drop test, after Foley catheter was placed free to straight drain.  An 8 mm robotic camera port was then placed in same location.  Laparoscopic examination of peritoneal cavity revealed no significant adhesions, no visceral injury.  There  were some mild, very thin adhesions in the right lower quadrant, likely consistent with remote history of appendicitis.  No active infection noted.  Additional ports were placed as follows:  Right paramedian 8 mm robotic port, right far lateral 12 mm  assist port, right paramedian 5 mm suction port, left paramedian 8 mm robotic port, left far lateral 8 mm robotic port.  Robot was docked and passed the electronic checks.  Attention was directed at development of space of Retzius.  Incision was made  lateral to the right median umbilical ligament from the midline towards the internal ring coursing along the iliac vessels towards the right ureter.  The right vas deferens was encountered, ligated using medial bucket handle and the right bladder wall  swept away from the pelvic sidewall towards the endopelvic fascia on the right side.  A mirror image dissection was performed on the left side.  Anterior attachments were taken down with cautery scissors to expose the anterior base of the prostate, which  was defatted to better denote the prostate and bladder neck junction.  This was set aside and labeled as periprostatic fat.  Next, 0.2 mL of indocyanine green dye was injected in each lobe of the prostate using a percutaneously placed robotically guided  spinal needle, which resulted in excellent parenchymal uptake of the prostate with sentinel lymph node dye.  Next the endopelvic fascia  was carefully swept away from the lateral aspect of the prostate in base to apex orientation.  This exposed the  dorsal venous complex and it was  carefully controlled using green load stapler, taking exquisite care to avoid membranous urethral injury, which did not occur.  It had been approximately 10 minutes post-dye injection, the pelvis was inspected under near  infrared fluorescence light.  Sentinel lymphangiography revealed excellent parenchymal uptake of the prostate with multiple lymphatic channels seen coursing towards the lymph node fields bilaterally.  There was, however, a paucity of true sentinel lymph nodes within the previous  lymph node fields.  Only a dominant node in the left perivesical location.  The perivesical node was set aside and labeled as such.  Hemostasis obtained with surgical clips.  Next, standard template lymphadenectomy was performed on the right side first.   The right external iliac group with boundaries being right external iliac artery, vein, pelvic sidewall.  Lymphostasis achieved with cold clips, and set aside labeled as right external iliac lymph nodes.  Next, right obturator group was dissected free  with the boundaries being right external iliac vein, pelvic sidewall, obturator nerve.  Lymphostasis achieved with cold clips and set aside labeled as right obturator lymph nodes.  The right obturator nerve was inspected following maneuvers and found to  be uninjured.  Next, a mirror image lymphadenectomy was performed on the left side, left external iliac and left obturator group respectively.  Hemostasis again achieved with cold clips.  Attention was directed at bladder neck dissection.  A lateral  release was performed on each side to better denote the bladder neck prostate junction, which was then carefully separated in anterior and posterior direction, keeping what appeared to be a rim of circular muscle fibers at each plane of dissection.   Posterior dissection was performed by incising approximately 7 mm inferior posterior to posterior lip of the prostate entering the plane of Denonvilliers.  Bilateral vas  deferens were encountered, dissected for a distance of approximately 4 cm, ligated,  and placed on gentle superior traction.  Bilateral seminal vesicles were dissected to their tip and were also placed on gentle superior traction and these were quite large.  I was quite happy with the completeness of this dissection.  Dissection  proceeded within the plane just anterior to the prerectal fat towards the area of the apex of the prostate.  This exposed the vascular pedicles bilaterally, which were controlled using bipolar energy device in a base to apex orientation, which was in  excellent hemostatic control of the pedicles.  A final apical dissection was performed in the anterior plane placing the prostate on gentle superior traction transecting the membranous urethra coldly.  This completely freed up the prostatectomy specimen  and it was placed into a EndoCatch bag for later retrieval.  Next, digital rectal exam was performed using indicator glove.  No evidence of rectal violation was noted.  Posterior reconstruction was performed using a 3-0 V-Loc suture reapproximating the  posterior urethral plate to the posterior bladder neck, bringing the structures into tension-free apposition.  Next mucosa-to-mucosa anastomosis was performed using double-armed 3-0 V-Loc suture from the 6 o'clock to the 12 o'clock position bilaterally.   This showed an excellent tension-free apposition.  A new Foley catheter was placed, which irrigated quantitatively.  Sponge and needle counts were correct.  Hemostasis was excellent.  A closed suction drain was brought out the previous left lateral most  robotic port site into the peritoneal cavity.  Robot  was undocked.  The previous right lateral most assistant port site was closed with fascia using Carter-Thompson suture passer and 0 Vicryl.  Specimen was retrieved by extending the previous camera  port site superiorly for a total distance of approximately 3 cm, removing the  prostatectomy specimen and set aside for permanent pathology.  Extraction site was closed with fascia using figure-of-eight PDS x 3 followed by reapproximation of Scarpa's with  running Vicryl.  All incision sites were infiltrated with dilute lipolyzed Marcaine and closed at the level of the skin using subcuticular Monocryl followed by Dermabond.  Procedure was then terminated.  The patient tolerated procedure well, no  immediate perioperative complications.  The patient was taken to postanesthesia care unit in stable condition.  Plan for observation admission, likely discharge home tomorrow pending his clinical status.  Please note, first assistant Aldine Contes, M.D. was crucial for all portions of the surgery today.  She provided invaluable retraction, suctioning, vascular clipping, vascular stapling, lymphatic clipping and general first assistance.   MUK D: 12/25/2021 11:01:06 am T: 12/26/2021 12:34:00 am  JOB: [id number]/ [id number]

## 2021-12-26 NOTE — Progress Notes (Addendum)
Pt has only urinated a total of 100cc throughout this shift. Bladder scanned at MN, result 0cc. Pt denies feeling fullness in bladder or any other discomfort. No distention noted.   Bladder scanned at 0415, results 0cc. Difficulty in obtaining an accurate amount in bladder.  Pt now states that he is feeling a fullness in his bladder, almost like gas. Pt abd now looks to be mildly distended. Pt was noted to have burped a few times as well during this time.   0430 RN placed call to on call urologist, awaiting call back at this time.  Urine color old blood tinged/tea colored

## 2021-12-26 NOTE — Progress Notes (Addendum)
Patient ID: David Wu, male   DOB: [birth year], 66 y.o.   MRN: [id number]  1 Day Post-Op Subjective: Patient reports hiccups since surgery.  No nausea or vomiting.  No chest pain or shortness of breath.  No flatus.  He had a vasovagal episode yesterday afternoon.  Hemoglobin was down and bolus given.  I have been in touch with nursing since 5 AM, monitoring patient, reviewing CT and placing orders.  Patient felt like he had urge to urinate or have a bowel movement.  Urine output decreased.  Nurse flushed the catheter without difficulty.  This can be a telltale sign of pelvic hematoma and CT scan did reveal a 6.5 cm hematoma posterior to the bladder as well as some blood tracking up from space of Retzius and up the retroperitoneum.  There is no hydronephrosis.  Bladder was not distended.  Catheter in good position.  Objective: Vital signs in last 24 hours: Temp:  [97.5 F (36.4 C)-99.2 F (37.3 C)] 98.4 F (36.9 C) (05/27 0850) Pulse Rate:  [51-94] 86 (05/27 0850) Resp:  [7-21] 19 (05/27 0850) BP: (106-175)/(53-85) 156/58 (05/27 0850) SpO2:  [94 %-100 %] 94 % (05/27 0850) Weight:  [98 kg] 98 kg (05/26 1300)  Intake/Output from previous day: 05/26 0701 - 05/27 0700 In: 3579.8 [P.O.:420; I.V.:2877.4; IV Piggyback:282.4] Out: 1035 [Urine:300; Drains:555; Blood:180] Intake/Output this shift: Total I/O In: -  Out: 90 [Urine:50; Drains:40]  Physical Exam:  No acute distress, watching TV.  Has hiccups.  Alert and oriented Cardiovascular-regular rate and rhythm Respiratory-clear to auscultation bilaterally Abdomen-mildly distended.  Soft and nontender.  Bowel sounds present.  Incisions clean dry and intact JP-bloody serous sanguinous drainage GU-Foley catheter in place.  Urine clear or light cola colored.  No bleeding per meatus. Extremity-SCD in place.  No calf pain or swelling.  Lab Results: Recent Labs    12/25/21 1711 12/26/21 0428 12/26/21 0936  HGB 9.7* 8.1* 7.9*   HCT 29.0* 23.9* 23.1*   BMET Recent Labs    12/25/21 1145 12/26/21 0428  NA 137 128*  K 3.6 3.7  CL 106 97*  CO2 23 21*  GLUCOSE 230* 182*  BUN 22 34*  CREATININE 1.44* 2.86*  CALCIUM 9.3 8.2*   No results for input(s): LABPT, INR in the last 72 hours. No results for input(s): LABURIN in the last 72 hours. No results found for this or any previous visit.  Studies/Results: CT ABDOMEN PELVIS WO CONTRAST  Result Date: 12/26/2021 CLINICAL DATA:  Abdominal pain. Status post prostatectomy with lymph node dissection 12/25/2021. EXAM: CT ABDOMEN AND PELVIS WITHOUT CONTRAST TECHNIQUE: Multidetector CT imaging of the abdomen and pelvis was performed following the standard protocol without IV contrast. RADIATION DOSE REDUCTION: This exam was performed according to the departmental dose-optimization program which includes automated exposure control, adjustment of the mA and/or kV according to patient size and/or use of iterative reconstruction technique. COMPARISON:  10/29/2021 FINDINGS: Lower chest: Dependent atelectasis noted in both lower lobes. Coronary artery calcification is evident. Hepatobiliary: No suspicious focal abnormality in the liver on this study without intravenous contrast. Layering tiny gallstones evident. No intrahepatic or extrahepatic biliary dilation. Pancreas: No focal mass lesion. No dilatation of the main duct. No intraparenchymal cyst. No peripancreatic edema. Spleen: No splenomegaly. No focal mass lesion. Adrenals/Urinary Tract: No adrenal nodule or mass. 5 mm nonobstructing stone interpolar right kidney. Bilateral renal cysts evident. No evidence for hydroureter. Foley catheter decompresses the bladder wall. Bladder wall thickening evident, likely related to underdistention. Stomach/Bowel:  Stomach is moderately distended and fluid-filled. Duodenum is normally positioned as is the ligament of Treitz. No small bowel wall thickening. No small bowel dilatation. The terminal  ileum is normal. The appendix is normal. No gross colonic mass. No colonic wall thickening. Vascular/Lymphatic: There is mild atherosclerotic calcification of the abdominal aorta without aneurysm. There is no gastrohepatic or hepatoduodenal ligament lymphadenopathy. No retroperitoneal or mesenteric lymphadenopathy. No pelvic sidewall lymphadenopathy. Reproductive: 6.5 x 5.1 x 5.0 cm high density collection in the central pelvis just cranial to the prostate bed in the region of the seminal vesicles is probably a postoperative hematoma. High attenuation material is seen tracking up along the left pelvic sidewall and in the pre vesicle space (89/2) compatible with small volume hemorrhage. Additional blood products are seen in the extraperitoneal anterior pelvis tracking up anterior to both psoas muscles (image 74/2). Edema or fluid is seen in the retroperitoneal space of the upper pelvis and lower abdomen bilaterally. Other: Left-sided surgical drain is positioned in the anterior pelvis. Extraperitoneal gas in the pelvic floor is compatible with recent surgery. Gas is seen in the left inguinal canal and in the suprapubic subcutaneous region along the dorsal aspect of the posterior penis. Gas dissects cranially in the rectus sheath and preperitoneal fat. There is some trace intraperitoneal gas along the gastric fundus with most of the gas seen deep to the rectus sheath likely in the preperitoneal space. Musculoskeletal: No worrisome lytic or sclerotic osseous abnormality. Degenerative changes noted lower lumbar spine and lumbosacral junction. Bilateral pars interarticularis defects noted at L5. IMPRESSION: 1. 6.5 x 5.1 x 5.0 cm high density collection in the central pelvis just cranial to the prostate bed and posterior to the bladder/urethra is compatible with hematoma 2. High attenuation and mixed attenuation fluid is seen tracking up along the left pelvic sidewall and in the pre vesicle space compatible with small  volume hemorrhage. Small volume extraperitoneal mixed attenuation hemorrhage tracks more cranially up anterior to the psoas muscles bilaterally. 3. Gas in the extraperitoneal soft tissues of the pelvic floor, within the anterior abdominal wall musculature, and in the preperitoneal space of the abdomen is consistent with recent surgery. There may be a trace amount of intraperitoneal gas in the region of the distal stomach 4. Cholelithiasis. 5. 5 mm nonobstructing right renal stone. 6. Bilateral renal cysts. 7. Aortic Atherosclerosis (ICD10-I70.0). Electronically Signed   By: Misty Stanley M.D.   On: 12/26/2021 07:04    Assessment/Plan: 1) acute blood loss anemia-likely from postop bleeding.  Closely monitor H&H. Will go ahead and transfuse given the trend in Hgb (now 7.9), AKI and inc in HR. JP output is decreasing. Would not consider take back to OR unless multiple txfn, increase in bleeding, etc.  2) acute on chronic kidney disease-likely from blood loss.  Hold benazepril.  Change IV fluids to normal saline. 3) hiccups-metoclopramide as needed.  We will continue clears and advance diet as tolerated.  Addendum - seen this afternoon. Pt getting 1U PRBC without difficulty. I spoke with Dr. Tresa Moore who agreed with blood txfn. He also notes the SVs were quite large and took more dissection than normal to remove. Therefore, I put 15 cc more in the foley and put it on slight traction to tamponade the SV or dorsal vein area. It does look like the JP is slowing down and thinning.     LOS: 0 days   Festus Aloe 12/26/2021, 9:53 AM

## 2021-12-26 NOTE — Progress Notes (Signed)
0625 pt taken to CT for scan of abdomen and pelvis. Pt was given zofran for nausea due to hiccupping.   Pt safely back in room. Hiccupping seems to have resolved at this time.

## 2021-12-27 LAB — TYPE AND SCREEN
ABO/RH(D): O POS
Antibody Screen: NEGATIVE
Unit division: 0

## 2021-12-27 LAB — BASIC METABOLIC PANEL WITH GFR
Anion gap: 10 (ref 5–15)
BUN: 37 mg/dL — ABNORMAL HIGH (ref 8–23)
CO2: 21 mmol/L — ABNORMAL LOW (ref 22–32)
Calcium: 7.9 mg/dL — ABNORMAL LOW (ref 8.9–10.3)
Chloride: 98 mmol/L (ref 98–111)
Creatinine, Ser: 2.51 mg/dL — ABNORMAL HIGH (ref 0.61–1.24)
GFR, Estimated: 27 mL/min — ABNORMAL LOW
Glucose, Bld: 196 mg/dL — ABNORMAL HIGH (ref 70–99)
Potassium: 3.6 mmol/L (ref 3.5–5.1)
Sodium: 129 mmol/L — ABNORMAL LOW (ref 135–145)

## 2021-12-27 LAB — GLUCOSE, CAPILLARY
Glucose-Capillary: 179 mg/dL — ABNORMAL HIGH (ref 70–99)
Glucose-Capillary: 154 mg/dL — ABNORMAL HIGH (ref 70–99)
Glucose-Capillary: 236 mg/dL — ABNORMAL HIGH (ref 70–99)
Glucose-Capillary: 231 mg/dL — ABNORMAL HIGH (ref 70–99)

## 2021-12-27 LAB — BPAM RBC
Blood Product Expiration Date: 202306262359
ISSUE DATE / TIME: 202305271200
Unit Type and Rh: 5100

## 2021-12-27 LAB — HEMOGLOBIN AND HEMATOCRIT, BLOOD
HCT: 24.9 % — ABNORMAL LOW (ref 39.0–52.0)
HCT: 25.3 % — ABNORMAL LOW (ref 39.0–52.0)
Hemoglobin: 8.2 g/dL — ABNORMAL LOW (ref 13.0–17.0)
Hemoglobin: 8.5 g/dL — ABNORMAL LOW (ref 13.0–17.0)

## 2021-12-27 NOTE — Progress Notes (Signed)
2 Days Post-Op Subjective: Pain well controlled. No nausea or emesis. Passing flatus, no BM. No weakness or dizziness. Hiccups resolved.  Objective: Vital signs in last 24 hours: Temp:  [98 F (36.7 C)-99.6 F (37.6 C)] 99.6 F (37.6 C) (05/28 0219) Pulse Rate:  [68-102] 102 (05/28 0219) Resp:  [10-18] 18 (05/28 0219) BP: (126-187)/(56-73) 157/73 (05/28 0219) SpO2:  [96 %-98 %] 97 % (05/28 0219)  Intake/Output from previous day: 05/27 0701 - 05/28 0700 In: 4047.7 [P.O.:1000; I.V.:2670.2; Blood:377.5] Out: 1280 [Urine:900; Drains:380] Intake/Output this shift: Total I/O In: 620 [P.O.:620] Out: 765 [Urine:675; Drains:90] UOP: 929m clear yellow JP: 3862mss  Physical Exam:  General: Alert and oriented CV: RRR Lungs: Clear Abdomen: Soft, ND, ATTP; inc c/d/I Ext: NT, No erythema  Lab Results: Recent Labs    12/26/21 0936 12/26/21 2105 12/27/21 0423  HGB 7.9* 8.7* 8.2*  HCT 23.1* 25.1* 24.9*   BMET Recent Labs    12/26/21 0428 12/27/21 0423  NA 128* 129*  K 3.7 3.6  CL 97* 98  CO2 21* 21*  GLUCOSE 182* 196*  BUN 34* 37*  CREATININE 2.86* 2.51*  CALCIUM 8.2* 7.9*     Studies/Results: CT ABDOMEN PELVIS WO CONTRAST  Result Date: 12/26/2021 CLINICAL DATA:  Abdominal pain. Status post prostatectomy with lymph node dissection 12/25/2021. EXAM: CT ABDOMEN AND PELVIS WITHOUT CONTRAST TECHNIQUE: Multidetector CT imaging of the abdomen and pelvis was performed following the standard protocol without IV contrast. RADIATION DOSE REDUCTION: This exam was performed according to the departmental dose-optimization program which includes automated exposure control, adjustment of the mA and/or kV according to patient size and/or use of iterative reconstruction technique. COMPARISON:  10/29/2021 FINDINGS: Lower chest: Dependent atelectasis noted in both lower lobes. Coronary artery calcification is evident. Hepatobiliary: No suspicious focal abnormality in the liver on this study  without intravenous contrast. Layering tiny gallstones evident. No intrahepatic or extrahepatic biliary dilation. Pancreas: No focal mass lesion. No dilatation of the main duct. No intraparenchymal cyst. No peripancreatic edema. Spleen: No splenomegaly. No focal mass lesion. Adrenals/Urinary Tract: No adrenal nodule or mass. 5 mm nonobstructing stone interpolar right kidney. Bilateral renal cysts evident. No evidence for hydroureter. Foley catheter decompresses the bladder wall. Bladder wall thickening evident, likely related to underdistention. Stomach/Bowel: Stomach is moderately distended and fluid-filled. Duodenum is normally positioned as is the ligament of Treitz. No small bowel wall thickening. No small bowel dilatation. The terminal ileum is normal. The appendix is normal. No gross colonic mass. No colonic wall thickening. Vascular/Lymphatic: There is mild atherosclerotic calcification of the abdominal aorta without aneurysm. There is no gastrohepatic or hepatoduodenal ligament lymphadenopathy. No retroperitoneal or mesenteric lymphadenopathy. No pelvic sidewall lymphadenopathy. Reproductive: 6.5 x 5.1 x 5.0 cm high density collection in the central pelvis just cranial to the prostate bed in the region of the seminal vesicles is probably a postoperative hematoma. High attenuation material is seen tracking up along the left pelvic sidewall and in the pre vesicle space (89/2) compatible with small volume hemorrhage. Additional blood products are seen in the extraperitoneal anterior pelvis tracking up anterior to both psoas muscles (image 74/2). Edema or fluid is seen in the retroperitoneal space of the upper pelvis and lower abdomen bilaterally. Other: Left-sided surgical drain is positioned in the anterior pelvis. Extraperitoneal gas in the pelvic floor is compatible with recent surgery. Gas is seen in the left inguinal canal and in the suprapubic subcutaneous region along the dorsal aspect of the posterior  penis. Gas dissects cranially in  the rectus sheath and preperitoneal fat. There is some trace intraperitoneal gas along the gastric fundus with most of the gas seen deep to the rectus sheath likely in the preperitoneal space. Musculoskeletal: No worrisome lytic or sclerotic osseous abnormality. Degenerative changes noted lower lumbar spine and lumbosacral junction. Bilateral pars interarticularis defects noted at L5. IMPRESSION: 1. 6.5 x 5.1 x 5.0 cm high density collection in the central pelvis just cranial to the prostate bed and posterior to the bladder/urethra is compatible with hematoma 2. High attenuation and mixed attenuation fluid is seen tracking up along the left pelvic sidewall and in the pre vesicle space compatible with small volume hemorrhage. Small volume extraperitoneal mixed attenuation hemorrhage tracks more cranially up anterior to the psoas muscles bilaterally. 3. Gas in the extraperitoneal soft tissues of the pelvic floor, within the anterior abdominal wall musculature, and in the preperitoneal space of the abdomen is consistent with recent surgery. There may be a trace amount of intraperitoneal gas in the region of the distal stomach 4. Cholelithiasis. 5. 5 mm nonobstructing right renal stone. 6. Bilateral renal cysts. 7. Aortic Atherosclerosis (ICD10-I70.0). Electronically Signed   By: Misty Stanley M.D.   On: 12/26/2021 07:04    Assessment/Plan: Prostate cancer s/p RALP with b/l PLND 5/26 Acute blood loss anemia: CT A/P 6.5 x 5.1 x 5.0 cm hematoma posterior to bladder. S/p 1u pRBC 5/27 AKI: Cr 2.51 on 5/28 from baseline 1.08  -Pain control prn -Diet as tolerated -Foley to gravity -JP to bulb. Will check JP creatinine prior to removal -Hgb stable at 8.2 after 1u pRBC -Trend creatinine. No hydro on CT.  -Favor keeping in house today for observation to ensure hgb stable and renal function improves   LOS: 1 day   Matt R. Azelea Seguin MD 12/27/2021, 9:55 AM [facility]  Pager:  [phone]

## 2021-12-28 LAB — CBC WITH DIFFERENTIAL/PLATELET
Abs Immature Granulocytes: 0.01 10*3/uL (ref 0.00–0.07)
Basophils Absolute: 0 10*3/uL (ref 0.0–0.1)
Basophils Relative: 0 %
Eosinophils Absolute: 0.2 10*3/uL (ref 0.0–0.5)
Eosinophils Relative: 3 %
HCT: 22.6 % — ABNORMAL LOW (ref 39.0–52.0)
Hemoglobin: 7.3 g/dL — ABNORMAL LOW (ref 13.0–17.0)
Immature Granulocytes: 0 %
Lymphocytes Relative: 19 %
Lymphs Abs: 1 10*3/uL (ref 0.7–4.0)
MCH: 29.4 pg (ref 26.0–34.0)
MCHC: 32.3 g/dL (ref 30.0–36.0)
MCV: 91.1 fL (ref 80.0–100.0)
Monocytes Absolute: 0.8 10*3/uL (ref 0.1–1.0)
Monocytes Relative: 14 %
Neutro Abs: 3.4 10*3/uL (ref 1.7–7.7)
Neutrophils Relative %: 64 %
Platelets: 100 10*3/uL — ABNORMAL LOW (ref 150–400)
RBC: 2.48 MIL/uL — ABNORMAL LOW (ref 4.22–5.81)
RDW: 15.9 % — ABNORMAL HIGH (ref 11.5–15.5)
WBC: 5.4 10*3/uL (ref 4.0–10.5)
nRBC: 0 % (ref 0.0–0.2)

## 2021-12-28 LAB — CREATININE, FLUID (PLEURAL, PERITONEAL, JP DRAINAGE): Creat, Fluid: 1.5 mg/dL

## 2021-12-28 LAB — BASIC METABOLIC PANEL
Anion gap: 5 (ref 5–15)
BUN: 32 mg/dL — ABNORMAL HIGH (ref 8–23)
CO2: 23 mmol/L (ref 22–32)
Calcium: 8 mg/dL — ABNORMAL LOW (ref 8.9–10.3)
Chloride: 108 mmol/L (ref 98–111)
Creatinine, Ser: 1.69 mg/dL — ABNORMAL HIGH (ref 0.61–1.24)
GFR, Estimated: 44 mL/min — ABNORMAL LOW (ref >60)
Glucose, Bld: 187 mg/dL — ABNORMAL HIGH (ref 70–99)
Potassium: 3.2 mmol/L — ABNORMAL LOW (ref 3.5–5.1)
Sodium: 136 mmol/L (ref 135–145)

## 2021-12-28 LAB — HEMOGLOBIN AND HEMATOCRIT, BLOOD
HCT: 22.8 % — ABNORMAL LOW (ref 39.0–52.0)
Hemoglobin: 7.9 g/dL — ABNORMAL LOW (ref 13.0–17.0)

## 2021-12-28 LAB — GLUCOSE, CAPILLARY
Glucose-Capillary: 232 mg/dL — ABNORMAL HIGH (ref 70–99)
Glucose-Capillary: 280 mg/dL — ABNORMAL HIGH (ref 70–99)
Glucose-Capillary: 220 mg/dL — ABNORMAL HIGH (ref 70–99)
Glucose-Capillary: 230 mg/dL — ABNORMAL HIGH (ref 70–99)

## 2021-12-28 MED ORDER — POTASSIUM CHLORIDE 10 MEQ/100ML IV SOLN
10.0000 meq | INTRAVENOUS | Status: AC
  Administered 2021-12-28 (×6): 10 meq via INTRAVENOUS
  Filled 2021-12-28 (×6): qty 100

## 2021-12-28 NOTE — Progress Notes (Addendum)
3 Days Post-Op Subjective: Typical post-op abd pain well controlled. No nausea or emesis. Feels bloated. Passing flatus, no emesis. Tolerating some diet, poor appetite.  Objective: Vital signs in last 24 hours: Temp:  [98.3 F (36.8 C)-98.9 F (37.2 C)] 98.9 F (37.2 C) (05/29 0352) Pulse Rate:  [79-88] 83 (05/29 0352) Resp:  [16-18] 18 (05/29 0352) BP: (138-158)/(55-65) 151/61 (05/29 0352) SpO2:  [94 %-97 %] 95 % (05/29 0352)  Intake/Output from previous day: 05/28 0701 - 05/29 0700 In: 3535.4 [P.O.:1420; I.V.:2115.4] Out: 3710 [Urine:3175; Drains:535] Intake/Output this shift: Total I/O In: 240 [P.O.:240] Out: 955 [Urine:750; Drains:205]  UOP: 3.1L clear yellow JP drain: 564m serosanguinous  Physical Exam:  General: Alert and oriented CV: RRR Lungs: Clear Abdomen: Soft, ND, ATTP; inc c/d/i Ext: NT, No erythema  Lab Results: Recent Labs    12/27/21 0423 12/27/21 1507 12/28/21 0347  HGB 8.2* 8.5* 7.3*  HCT 24.9* 25.3* 22.6*   BMET Recent Labs    12/27/21 0423 12/28/21 0347  NA 129* 136  K 3.6 3.2*  CL 98 108  CO2 21* 23  GLUCOSE 196* 187*  BUN 37* 32*  CREATININE 2.51* 1.69*  CALCIUM 7.9* 8.0*     Studies/Results: No results found.  Assessment/Plan: Prostate cancer s/p RALP with b/l PLND 5/26 Acute blood loss anemia: CT A/P 6.5 x 5.1 x 5.0 cm hematoma posterior to bladder. S/p 1u pRBC 5/27 AKI: Cr 2.51 on 5/28 from baseline 1.08  -Pain control prn -Diet as tolerated -Foley to gravity. Has been off traction for 24 hours now. -JP to bulb. Still high output, around 5384m is serosanguinous. -Hgb down to 7.3 from 8.5 yesterday PM. Recheck at noon. Will transfuse if <7. -Check JP creatinine -Serum creatinine downtrending to 1.69. -Saline lock.  -Replete K -OOB, amb, IS  Addendum 1:20PM: -Noted repeat H/H stable at 7.9. No need for transfusion. -JP drainage still elevated, over 20082mlready this shift, still SS. -JP creatinine seroequivalent  1.5 -Favor keeping in house and removing JP once drainage declines.   LOS: 2 days   Matt R. Ashtyn Freilich MD 12/28/2021, 10:10 AM [facility]  Pager: [phone]

## 2021-12-29 LAB — CBC WITH DIFFERENTIAL/PLATELET
Abs Immature Granulocytes: 0.03 10*3/uL (ref 0.00–0.07)
Basophils Absolute: 0 10*3/uL (ref 0.0–0.1)
Basophils Relative: 1 %
Eosinophils Absolute: 0.2 10*3/uL (ref 0.0–0.5)
Eosinophils Relative: 4 %
HCT: 22.7 % — ABNORMAL LOW (ref 39.0–52.0)
Hemoglobin: 7.7 g/dL — ABNORMAL LOW (ref 13.0–17.0)
Immature Granulocytes: 1 %
Lymphocytes Relative: 21 %
Lymphs Abs: 1 10*3/uL (ref 0.7–4.0)
MCH: 30.4 pg (ref 26.0–34.0)
MCHC: 33.9 g/dL (ref 30.0–36.0)
MCV: 89.7 fL (ref 80.0–100.0)
Monocytes Absolute: 0.6 10*3/uL (ref 0.1–1.0)
Monocytes Relative: 11 %
Neutro Abs: 3.1 10*3/uL (ref 1.7–7.7)
Neutrophils Relative %: 62 %
Platelets: 138 10*3/uL — ABNORMAL LOW (ref 150–400)
RBC: 2.53 MIL/uL — ABNORMAL LOW (ref 4.22–5.81)
RDW: 15.9 % — ABNORMAL HIGH (ref 11.5–15.5)
WBC: 5 10*3/uL (ref 4.0–10.5)
nRBC: 0 % (ref 0.0–0.2)

## 2021-12-29 LAB — BASIC METABOLIC PANEL
Anion gap: 6 (ref 5–15)
BUN: 22 mg/dL (ref 8–23)
CO2: 25 mmol/L (ref 22–32)
Calcium: 8.4 mg/dL — ABNORMAL LOW (ref 8.9–10.3)
Chloride: 107 mmol/L (ref 98–111)
Creatinine, Ser: 1.44 mg/dL — ABNORMAL HIGH (ref 0.61–1.24)
GFR, Estimated: 53 mL/min — ABNORMAL LOW (ref >60)
Glucose, Bld: 235 mg/dL — ABNORMAL HIGH (ref 70–99)
Potassium: 3.2 mmol/L — ABNORMAL LOW (ref 3.5–5.1)
Sodium: 138 mmol/L (ref 135–145)

## 2021-12-29 LAB — GLUCOSE, CAPILLARY
Glucose-Capillary: 226 mg/dL — ABNORMAL HIGH (ref 70–99)
Glucose-Capillary: 281 mg/dL — ABNORMAL HIGH (ref 70–99)
Glucose-Capillary: 251 mg/dL — ABNORMAL HIGH (ref 70–99)

## 2021-12-29 MED ORDER — HYDRALAZINE HCL 20 MG/ML IJ SOLN
10.0000 mg | Freq: Once | INTRAMUSCULAR | Status: AC
  Administered 2021-12-29: 10 mg via INTRAVENOUS
  Filled 2021-12-29: qty 1

## 2021-12-29 NOTE — Progress Notes (Signed)
Inpatient Diabetes Program Recommendations  AACE/ADA: New Consensus Statement on Inpatient Glycemic Control (2015)  Target Ranges:  Prepandial:   less than 140 mg/dL      Peak postprandial:   less than 180 mg/dL (1-2 hours)      Critically ill patients:  140 - 180 mg/dL   Lab Results  Component Value Date   GLUCAP 226 (H) 12/29/2021   HGBA1C 6.9 (H) 12/14/2021    Review of Glycemic Control  Latest Reference Range & Units 12/28/21 07:27 12/28/21 11:36 12/28/21 16:24 12/28/21 20:53 12/29/21 07:47  Glucose-Capillary 70 - 99 mg/dL 232 (H) 280 (H) 220 (H) 230 (H) 226 (H)  (H): Data is abnormally high  Diabetes history: DM2 Outpatient Diabetes medications: Metformin 500 mg BID, Amaryl 1 mg BID, Actos 45 mg QD Current orders for Inpatient glycemic control: Novolog 0-15 units TID and 0-5 units QHS  Inpatient Diabetes Program Recommendations:    Semglee 10 units QD  Will continue to follow while inpatient.  Thank you, Reche Dixon, MSN, [place] Diabetes Coordinator Inpatient Diabetes Program [phone] (team pager from 8a-5p)

## 2021-12-29 NOTE — Discharge Summary (Signed)
Physician Discharge Summary  Patient ID: David Wu MRN: [id number] DOB/AGE: Feb 27, 1956 66 y.o.  Admit date: 12/25/2021 Discharge date: 12/29/2021  Admission Diagnoses: Prostate Cancer  Discharge Diagnoses:  Principal Problem:   Prostate cancer ([place]) Acute Blood Loss Anemia  Discharged Condition: fair  Hospital Course:    1- Prostate Cancer - s/p robotic radical prostatectomy with node dissection on 12/25/21. Path pending at discharge. JP removed prior to discharge as Cr same as serum.  2 - Acute Blood Loss Anemia - pt with Hgb drift with some orthostasis noted POD 1. Initial post-op Hgb 11.6. Drop to 7.9 POD 1 on serial labs and 1upRBC given, placed on bed rest. CT with some pelvic / retroperitoneal hematoma as expected mostly present in pre-rectal space. Hgb stabilized and returned to ambulation. By 5/30, the day of discharge, Hgb stable at 7.7 x 48 hours, ambulating without orthostasis.  By the afternoon of POD 4, the day of discharge, he is ambulatory, tollerating PO nutrition with vigorous flatus, Pain controlled on PO meds, Hgb stable, and felt to be adequate for discharge.   Consults: None  Significant Diagnostic Studies: labs: Path - pending; Serial Hgb as per above, CT as per above  Treatments: prostatectomy, transfusion  Discharge Exam: Blood pressure (!) 160/80, pulse 92, temperature 98.2 F (36.8 C), temperature source Oral, resp. rate 20, height 6' (1.829 m), weight 98 kg, SpO2 95 %.  NAD, wife at bedside, AOx3 Non-labored breathing on RA RRR SNTND, stable baseline mild obesity. Some Rt sided flank ecchymoses w/o fluctuence (abtou 20cm x 12 cm) that ist stable JP removed as output minimal and non-foul, dry dressing applied Foley in place with medium yellow non-foul urine No c/d/e  Disposition: Discharge disposition: 01-Home or Self Care          Follow-up Information     Alexis Frock, MD Follow up on 01/04/2022.   Specialty: Urology Why: at  9:45 for MD visit, pathology review, and catheter removal Contact information: [place] [address] [phone]                 Signed: Alexis Frock 12/29/2021, 4:58 PM

## 2021-12-29 NOTE — Progress Notes (Signed)
Chaplain completed Advanced Directive paperwork, having it witnessed and notarized.  Chaplain gave them three copies, including the original, and input one in [facility] medical chart.      12/29/21 1600  Clinical Encounter Type  Visited With Patient and family together  Visit Type Follow-up;Social support

## 2021-12-31 LAB — SURGICAL PATHOLOGY

## 2022-11-11 IMAGING — CT CT ABD-PELV W/O CM
2 of 4 series · 14 of 46 positions shown, 16 images · non-contrast
Comparison: 10/29/2021

CLINICAL DATA: Abdominal pain. Status post prostatectomy with lymph
node dissection 12/25/2021.



[Series 2: axial st · axial · 0.90mm/px · z∈[+997,+1507]mm · 11 of 116 slices shown, 13 images]
[im 7/116  soft-tissue]
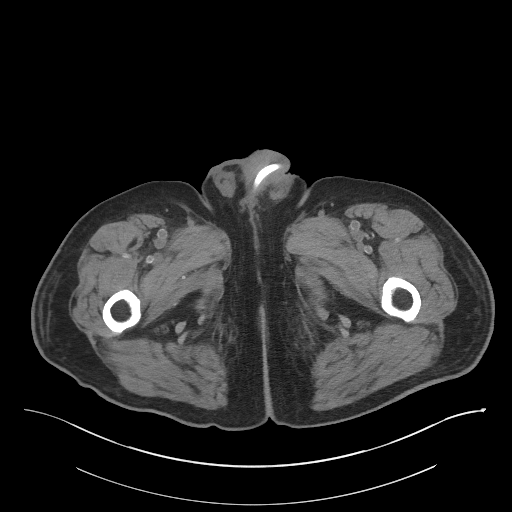
[im 7/116  bone]
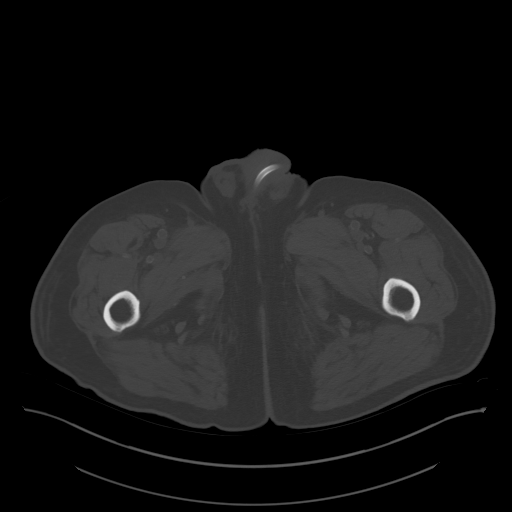
[im 20/116  soft-tissue]
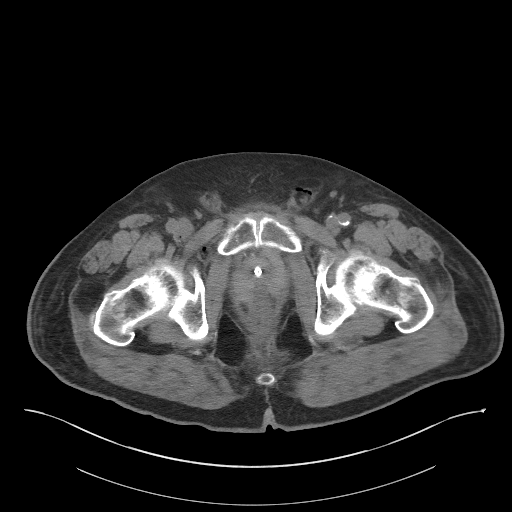
[im 26/116  soft-tissue]
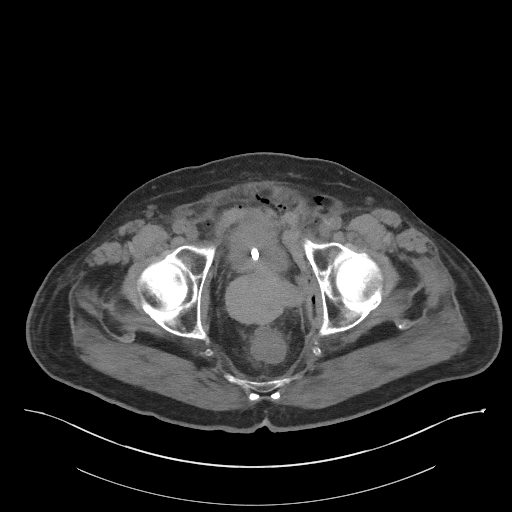
[im 39/116  soft-tissue]
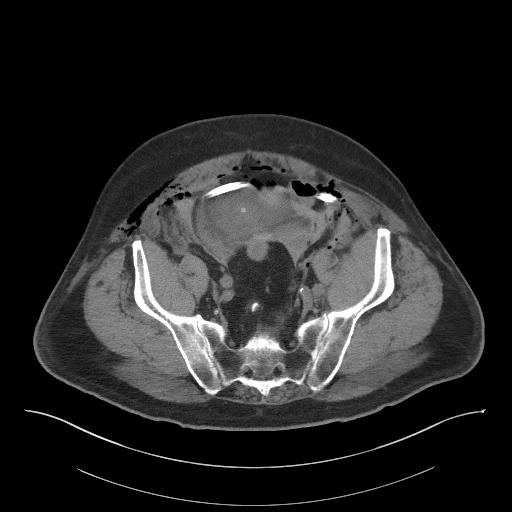
[im 45/116  soft-tissue]
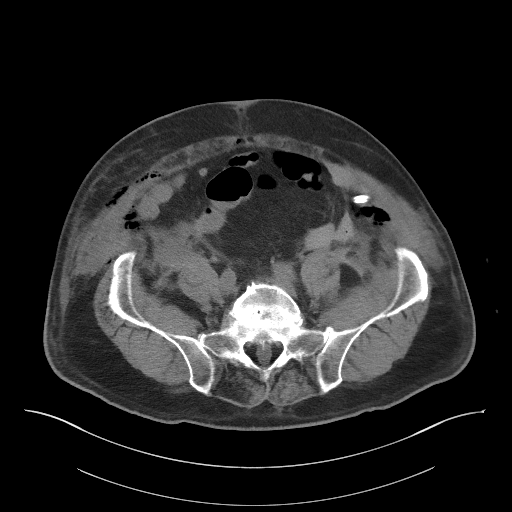
[im 58/116  soft-tissue]
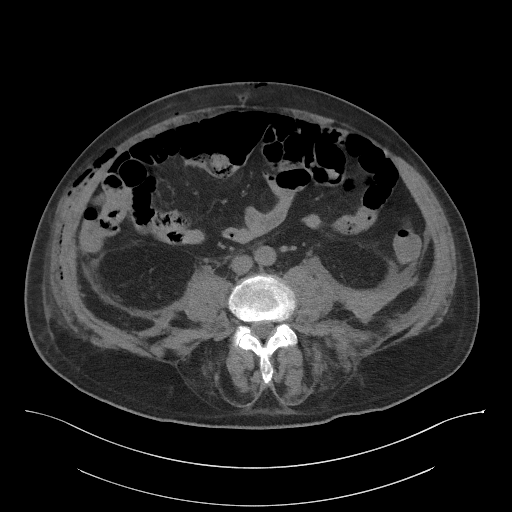
[im 71/116  soft-tissue]
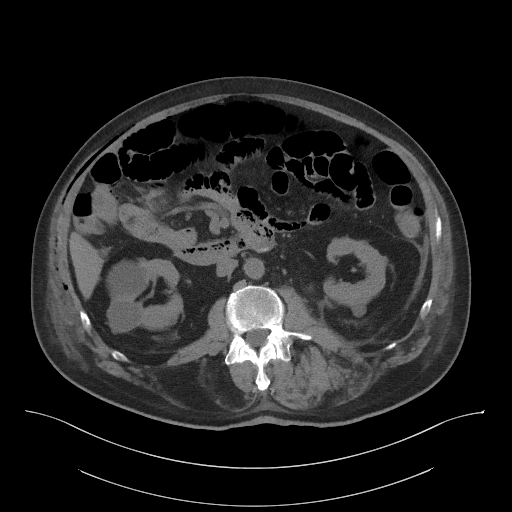
[im 77/116  soft-tissue]
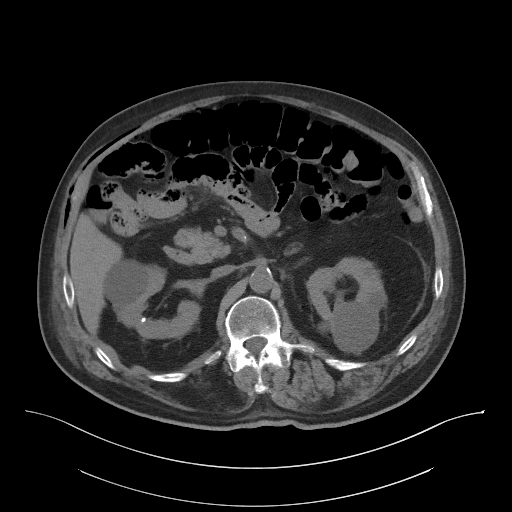
[im 90/116  soft-tissue]
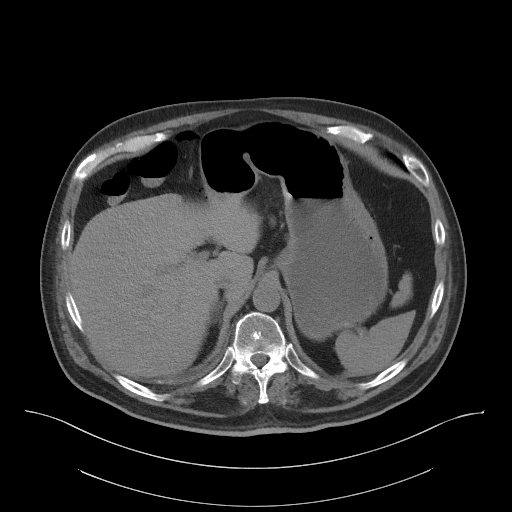
[im 90/116  bone]
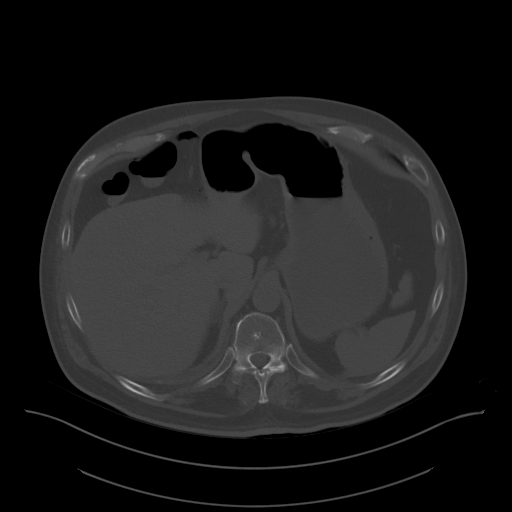
[im 96/116  soft-tissue]
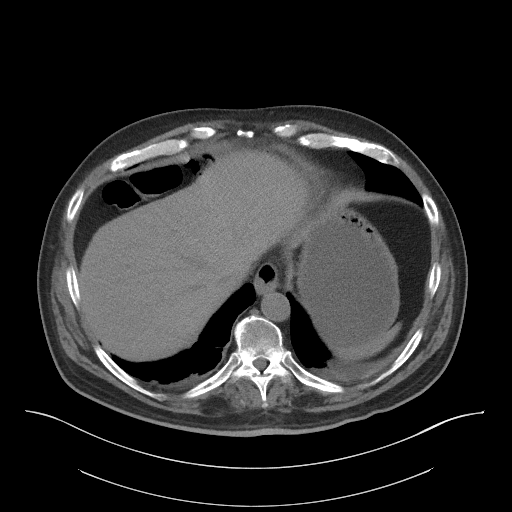
[im 109/116  soft-tissue]
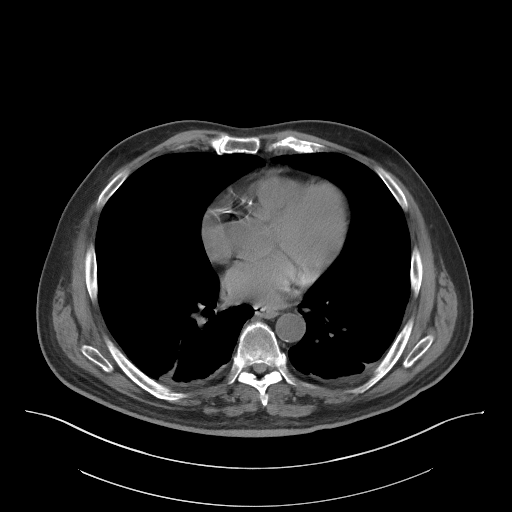

[Series 5: coronal st · coronal · 0.85mm/px · 3 of 110 slices shown]
[im 37/110  soft-tissue]
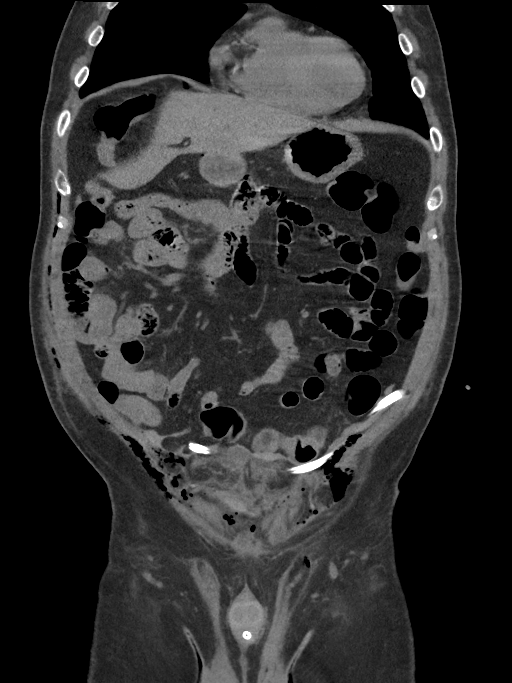
[im 49/110  soft-tissue]
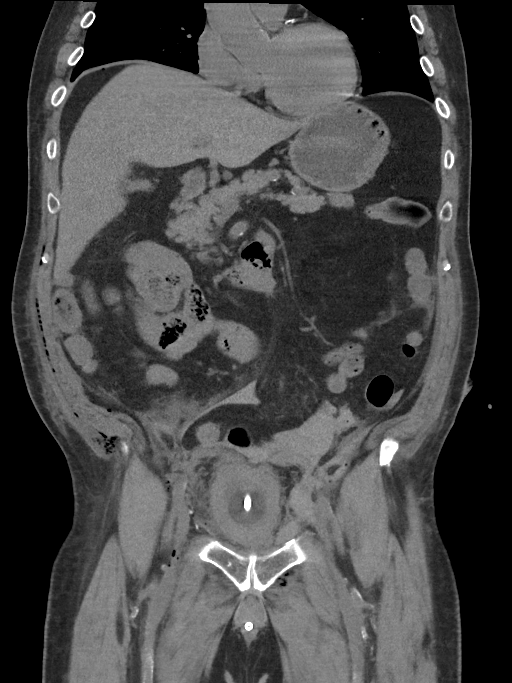
[im 61/110  soft-tissue]
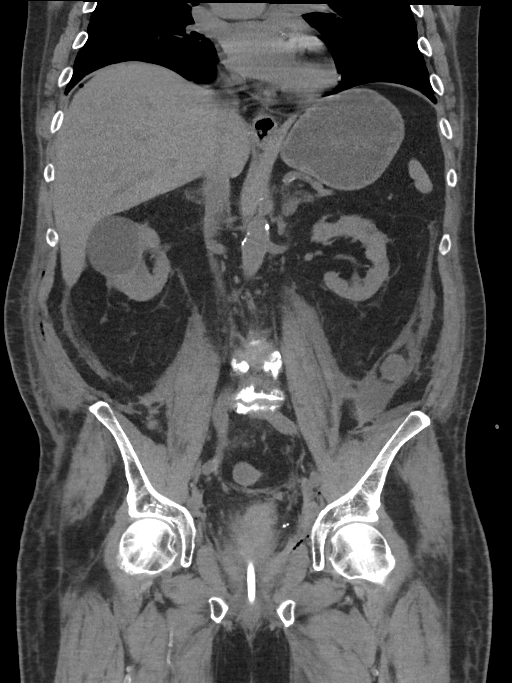

[14 of 46 positions shown; findings below may reference images not displayed]

FINDINGS: Lower chest: Dependent atelectasis noted in both lower lobes.
Coronary artery calcification is evident.

Hepatobiliary: No suspicious focal abnormality in the liver on this
study without intravenous contrast. Layering tiny gallstones
evident. No intrahepatic or extrahepatic biliary dilation.

Pancreas: No focal mass lesion. No dilatation of the main duct. No
intraparenchymal cyst. No peripancreatic edema.

Spleen: No splenomegaly. No focal mass lesion.

Adrenals/Urinary Tract: No adrenal nodule or mass. 5 mm
nonobstructing stone interpolar right kidney. Bilateral renal cysts
evident. No evidence for hydroureter. Foley catheter decompresses
the bladder wall. Bladder wall thickening evident, likely related to
underdistention.

Stomach/Bowel: Stomach is moderately distended and fluid-filled.
Duodenum is normally positioned as is the ligament of Treitz. No
small bowel wall thickening. No small bowel dilatation. The terminal
ileum is normal. The appendix is normal. No gross colonic mass. No
colonic wall thickening.

Vascular/Lymphatic: There is mild atherosclerotic calcification of
the abdominal aorta without aneurysm. There is no gastrohepatic or
hepatoduodenal ligament lymphadenopathy. No retroperitoneal or
mesenteric lymphadenopathy. No pelvic sidewall lymphadenopathy.

Reproductive: 6.5 x 5.1 x 5.0 cm high density collection in the
central pelvis just cranial to the prostate bed in the region of the
seminal vesicles is probably a postoperative hematoma. High
attenuation material is seen tracking up along the left pelvic
sidewall and in the pre vesicle space (89/2) compatible with small
volume hemorrhage. Additional blood products are seen in the
extraperitoneal anterior pelvis tracking up anterior to both psoas
muscles (image 74/2). Edema or fluid is seen in the retroperitoneal
space of the upper pelvis and lower abdomen bilaterally.

Other: Left-sided surgical drain is positioned in the anterior
pelvis. Extraperitoneal gas in the pelvic floor is compatible with
recent surgery. Gas is seen in the left inguinal canal and in the
suprapubic subcutaneous region along the dorsal aspect of the
posterior penis. Gas dissects cranially in the rectus sheath and
preperitoneal fat. There is some trace intraperitoneal gas along the
gastric fundus with most of the gas seen deep to the rectus sheath
likely in the preperitoneal space.

Musculoskeletal: No worrisome lytic or sclerotic osseous
abnormality. Degenerative changes noted lower lumbar spine and
lumbosacral junction. Bilateral pars interarticularis defects noted
at L5.
IMPRESSION: 1. 6.5 x 5.1 x 5.0 cm high density collection in the central pelvis
just cranial to the prostate bed and posterior to the
bladder/urethra is compatible with hematoma
2. High attenuation and mixed attenuation fluid is seen tracking up
along the left pelvic sidewall and in the pre vesicle space
compatible with small volume hemorrhage. Small volume
extraperitoneal mixed attenuation hemorrhage tracks more cranially
up anterior to the psoas muscles bilaterally.
3. Gas in the extraperitoneal soft tissues of the pelvic floor,
within the anterior abdominal wall musculature, and in the
preperitoneal space of the abdomen is consistent with recent
surgery. There may be a trace amount of intraperitoneal gas in the
region of the distal stomach
4. Cholelithiasis.
5. 5 mm nonobstructing right renal stone.
6. Bilateral renal cysts.
7. Aortic Atherosclerosis ([id number]).

## 2023-05-11 ENCOUNTER — Telehealth: Payer: Self-pay | Admitting: Gastroenterology

## 2023-05-11 NOTE — Telephone Encounter (Signed)
Pt was notified of the urgent recall. Pt was scheduled for an office visit to  see Dr. Chales Abrahams at 2:30 PM. Pt made aware. Address provided. Pt verbalized understanding with all questions answered.

## 2023-05-11 NOTE — Telephone Encounter (Signed)
Urgent referral in [place] for anemia.  Patient last saw Dr. Chales Abrahams in [place] in 2018.  Please review and advise urgency and scheduling.  Thanks

## 2023-05-18 ENCOUNTER — Other Ambulatory Visit (INDEPENDENT_AMBULATORY_CARE_PROVIDER_SITE_OTHER): Payer: Medicare Other

## 2023-05-18 ENCOUNTER — Encounter: Payer: Self-pay | Admitting: Gastroenterology

## 2023-05-18 ENCOUNTER — Ambulatory Visit (INDEPENDENT_AMBULATORY_CARE_PROVIDER_SITE_OTHER): Payer: Medicare Other | Admitting: Gastroenterology

## 2023-05-18 VITALS — BP 152/70 | HR 76 | Ht 70.0 in | Wt 221.1 lb

## 2023-05-18 DIAGNOSIS — D509 Iron deficiency anemia, unspecified: Secondary | ICD-10-CM | POA: Diagnosis not present

## 2023-05-18 LAB — CBC WITH DIFFERENTIAL/PLATELET
Basophils Absolute: 0.1 10*3/uL (ref 0.0–0.1)
Basophils Relative: 1.4 % (ref 0.0–3.0)
Eosinophils Absolute: 0.2 10*3/uL (ref 0.0–0.7)
Eosinophils Relative: 3 % (ref 0.0–5.0)
HCT: 36.3 % — ABNORMAL LOW (ref 39.0–52.0)
Hemoglobin: 12.1 g/dL — ABNORMAL LOW (ref 13.0–17.0)
Lymphocytes Relative: 15.7 % (ref 12.0–46.0)
Lymphs Abs: 0.8 10*3/uL (ref 0.7–4.0)
MCHC: 33.3 g/dL (ref 30.0–36.0)
MCV: 92.4 fL (ref 78.0–100.0)
Monocytes Absolute: 0.6 10*3/uL (ref 0.1–1.0)
Monocytes Relative: 11.1 % (ref 3.0–12.0)
Neutro Abs: 3.5 10*3/uL (ref 1.4–7.7)
Neutrophils Relative %: 68.8 % (ref 43.0–77.0)
Platelets: 133 10*3/uL — ABNORMAL LOW (ref 150.0–400.0)
RBC: 3.93 Mil/uL — ABNORMAL LOW (ref 4.22–5.81)
RDW: 15.1 % (ref 11.5–15.5)
WBC: 5.1 10*3/uL (ref 4.0–10.5)

## 2023-05-18 LAB — COMPREHENSIVE METABOLIC PANEL
ALT: 16 U/L (ref 0–53)
AST: 13 U/L (ref 0–37)
Albumin: 4.1 g/dL (ref 3.5–5.2)
Alkaline Phosphatase: 46 U/L (ref 39–117)
BUN: 26 mg/dL — ABNORMAL HIGH (ref 6–23)
CO2: 26 meq/L (ref 19–32)
Calcium: 9.8 mg/dL (ref 8.4–10.5)
Chloride: 104 meq/L (ref 96–112)
Creatinine, Ser: 1.7 mg/dL — ABNORMAL HIGH (ref 0.40–1.50)
GFR: 40.8 mL/min — ABNORMAL LOW (ref >60.00)
Glucose, Bld: 183 mg/dL — ABNORMAL HIGH (ref 70–99)
Potassium: 3.8 meq/L (ref 3.5–5.1)
Sodium: 141 meq/L (ref 135–145)
Total Bilirubin: 0.4 mg/dL (ref 0.2–1.2)
Total Protein: 6.7 g/dL (ref 6.0–8.3)

## 2023-05-18 NOTE — Patient Instructions (Addendum)
Your provider has requested that you go to the basement level for lab work before leaving today. Press "B" on the elevator. The lab is located at the first door on the left as you exit the elevator.  Follow the instructions on the Hemoccult cards and mail them back to [place] when you are finished or you may take them directly to the lab in the basement of the [place] building. We will call you with the results.    You have been scheduled for an endoscopy and colonoscopy. Please follow the written instructions given to you at your visit today.  Please pick up your prep supplies at the pharmacy within the next 1-3 days.  If you use inhalers (even only as needed), please bring them with you on the day of your procedure.  DO NOT TAKE 7 DAYS PRIOR TO TEST- Trulicity (dulaglutide) Ozempic, Wegovy (semaglutide) Mounjaro (tirzepatide) Bydureon Bcise (exanatide extended release)  DO NOT TAKE 1 DAY PRIOR TO YOUR TEST Rybelsus (semaglutide) Adlyxin (lixisenatide) Victoza (liraglutide) Byetta (exanatide) ___________________________________________________________________________ _______________________________________________________  If your blood pressure at your visit was 140/90 or greater, please contact your primary care physician to follow up on this.  _______________________________________________________  If you are age 14 or older, your body mass index should be between 23-30. Your Body mass index is 31.73 kg/m. If this is out of the aforementioned range listed, please consider follow up with your Primary Care Provider.  If you are age 92 or younger, your body mass index should be between 19-25. Your Body mass index is 31.73 kg/m. If this is out of the aformentioned range listed, please consider follow up with your Primary Care Provider.   ________________________________________________________  The [facility] providers would like to encourage you to use [facility] to communicate with  providers for non-urgent requests or questions.  Due to long hold times on the telephone, sending your provider a message by [facility] may be a faster and more efficient way to get a response.  Please allow 48 business hours for a response.  Please remember that this is for non-urgent requests.  _______________________________________________________

## 2023-05-18 NOTE — Progress Notes (Signed)
Chief Complaint: IDA  Referring Provider:  Hurshel Party, NP      ASSESSMENT AND PLAN;   #1. IDA #2. FH celiac (brother)   Plan: -CBC, CMP, celiac serology -Hemoccult cards x 3 -EGD/colon -If neg, CT AP with contrast followed by hematology workup.    I discussed EGD/Colonoscopy- the indications, risks, alternatives and potential complications including, but not limited to bleeding, infection, reaction to meds, damage to internal organs, cardiac and/or pulmonary problems, and perforation requiring surgery. The possibility that significant findings could be missed was explained. All ? were answered. Pt consents to proceed.    HPI:    David Wu is a 67 y.o. male  CAD (Nl EF, neg cath July 2022), HLD, CKD 2, history of leukopenia, obesity, HTN, DM 2, asymptomatic cholelithiasis  With chronic IDA.  Here for repeat GI evaluation prior to hematology evaluation.  Hb 12, MCV95, plt 134. Cr 1.3 Sept 2024  No nausea, vomiting, heartburn, regurgitation, odynophagia or dysphagia.  No significant diarrhea or constipation.  No melena or hematochezia. No unintentional weight loss. No abdominal pain.  No H/O itching, skin lesions, easy bruisability, intake of OTC meds including diet pills, herbal medications, anabolic steroids or Tylenol. There is no H/O blood transfusions, IVDA or FH of liver disease. No jaundice, dark urine or pale stools. No alcohol abuse.  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 Hb drifted down to 7.3 s/p 2U to 7.7 with borderline platelet count 138K. Hemoglobin only gradually improved to 12 (September 2024).  Advised to repeat GI workup.  No sodas, chocolates, chewing gums, artificial sweeteners and candy. No NSAIDs   Previous GI workup:  Colonoscopy 01/2015 ([facility]) for heme positive stools. Hb 14.4 -Mild sigmoid diverticulosis -Otherwise normal colonoscopy to TI. -Also had colonoscopy 07/2009: Mild  sigmoid diverticulosis.  Otherwise normal to TI.  Negative biopsies for microscopic colitis.  EGD 04/30/2015 for history of anemia -Mild gastritis.  Negative biopsies for HP -Incidental gastric polyps s/p polypectomy x 2. Bx-fundic gland polyps. -Negative small bowel biopsies for celiac -EGD 04/2007-post bulbar duodenal ulcer.  Negative H. pylori.   CT AP 12/26/2021 1. 6.5 x 5.1 x 5.0 cm high density collection in the central pelvis just cranial to the prostate bed and posterior to the bladder/urethra is compatible with hematoma 2. High attenuation and mixed attenuation fluid is seen tracking up along the left pelvic sidewall and in the pre vesicle space compatible with small volume hemorrhage. Small volume extraperitoneal mixed attenuation hemorrhage tracks more cranially up anterior to the psoas muscles bilaterally. 3. Gas in the extraperitoneal soft tissues of the pelvic floor, within the anterior abdominal wall musculature, and in the preperitoneal space of the abdomen is consistent with recent surgery. There may be a trace amount of intraperitoneal gas in the region of the distal stomach 4. Cholelithiasis. 5. 5 mm nonobstructing right renal stone. 6. Bilateral renal cysts. 7. Aortic Atherosclerosis (ICD10-I70.0).  Past Medical History:  Diagnosis Date   Abnormal nuclear stress test 11/20/2019   Bleeding ulcer    BMI 27.0-27.9,adult    CAD (coronary artery disease) 11/20/2019   Chronic fatigue 04/22/2015   Diabetes (HCC)    Diabetes mellitus due to underlying condition with unspecified complications (HCC) 07/20/2019   Essential hypertension 04/22/2015   Hearing loss    History of bleeding ulcers    History of kidney stones    Hyperlipidemia    Hypertension, essential, benign    Kidney stones  Mixed dyslipidemia 07/20/2019   Nonspecific abnormal electrocardiogram (ECG) (EKG) 07/20/2019   Prostate cancer (HCC) 11/2021   Shortness of breath 04/22/2015   Skin cancer     Stomach ulcer    Type 2 diabetes mellitus with stage 3 chronic kidney disease and hypertension (HCC)    Type 2 diabetes mellitus without complication (HCC) 04/22/2015    Past Surgical History:  Procedure Laterality Date   CATARACT EXTRACTION Left 2008   CATARACT EXTRACTION Right 2018   COLONOSCOPY  02/19/2015   Mild digmoid diverticulosis. 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   LYMPH NODE DISSECTION Bilateral 12/25/2021   Procedure: LYMPH NODE DISSECTION;  Surgeon: Sebastian Ache, MD;  Location: [facility];  Service: Urology;  Laterality: Bilateral;   ROBOT ASSISTED LAPAROSCOPIC RADICAL PROSTATECTOMY N/A 12/25/2021   Procedure: XI ROBOTIC ASSISTED LAPAROSCOPIC RADICAL PROSTATECTOMY WITH INDOCYANINE GREEN DYE;  Surgeon: Sebastian Ache, MD;  Location: [facility];  Service: Urology;  Laterality: N/A;    Family History  Problem Relation Age of Onset   Alzheimer's disease Mother    Hypertension Mother    Stroke Father    Hypertension Father    Celiac disease Brother    Hypertension Paternal Grandfather    Hyperlipidemia Son     Social History   Tobacco Use   Smoking status: Never   Smokeless tobacco: Never  Vaping Use   Vaping status: Never Used  Substance Use Topics   Alcohol use: Never   Drug use: Never    Current Outpatient Medications  Medication Sig Dispense Refill   amLODipine-benazepril (LOTREL) 10-40 MG capsule Take 1 capsule by mouth daily.     atorvastatin (LIPITOR) 40 MG tablet Take 40 mg by mouth daily.     cloNIDine (CATAPRES) 0.1 MG tablet Take 0.1 mg by mouth 2 (two) times daily.     Coenzyme Q10 (CO Q-10) 100 MG CHEW Chew 100 mg by mouth daily.     Cyanocobalamin 2000 MCG TBCR Take 2,000 mcg by mouth once a week.     fenofibrate 54 MG tablet Take 54  mg by mouth daily.     glimepiride (AMARYL) 1 MG tablet Take 1 mg by mouth 2 (two) times daily.     hydrochlorothiazide (HYDRODIURIL) 25 MG tablet Take 25 mg by mouth daily.     labetalol (NORMODYNE) 200 MG tablet Take 400 mg by mouth 2 (two) times daily.     metFORMIN (GLUCOPHAGE) 500 MG tablet Take 500 mg by mouth 2 (two) times daily with a meal.     pioglitazone (ACTOS) 45 MG tablet Take 45 mg by mouth daily.     Potassium Chloride ER 20 MEQ TBCR Take 20 mEq by mouth 2 (two) times daily.     Vitamin D, Ergocalciferol, (DRISDOL) 1.25 MG (50000 UNIT) CAPS capsule Take 50,000 Units by mouth every 7 (seven) days.     nitroGLYCERIN (NITROSTAT) 0.4 MG SL tablet Place 0.4 mg under the tongue every 5 (five) minutes as needed for chest pain. (Patient not taking: Reported on 05/18/2023)     No current facility-administered medications for this visit.    No Known Allergies  Review of Systems:  Constitutional: Denies fever, chills, diaphoresis, appetite change and fatigue.  HEENT: Denies photophobia, eye pain, redness, hearing loss, ear pain,  congestion, sore throat, rhinorrhea, sneezing, mouth sores, neck pain, neck stiffness and tinnitus.   Respiratory: Denies SOB, DOE, cough, chest tightness,  and wheezing.   Cardiovascular: Denies chest pain, palpitations and leg swelling.  Genitourinary: Denies dysuria, urgency, frequency, hematuria, flank pain and difficulty urinating.  Musculoskeletal: Denies myalgias, back pain, joint swelling, arthralgias and gait problem.  Skin: No rash.  Neurological: Denies dizziness, seizures, syncope, weakness, light-headedness, numbness and headaches.  Hematological: Denies adenopathy. Easy bruising, personal or family bleeding history  Psychiatric/Behavioral: No anxiety or depression     Physical Exam:    BP (!) 152/70 (BP Location: Left Arm, Patient Position: Sitting, Cuff Size: Normal)   Pulse 76   Ht 5\' 10"  (1.778 m) Comment: height measured without shoes   Wt 221 lb 2 oz (100.3 kg)   BMI 31.73 kg/m  Wt Readings from Last 3 Encounters:  05/18/23 221 lb 2 oz (100.3 kg)  12/25/21 216 lb 0.8 oz (98 kg)  12/14/21 213 lb (96.6 kg)   Constitutional:  Well-developed, in no acute distress. Psychiatric: Normal mood and affect. Behavior is normal. HEENT: Pupils normal.  Conjunctivae are normal. No scleral icterus. Neck supple.  Cardiovascular: Normal rate, regular rhythm. No edema Pulmonary/chest: Effort normal and breath sounds normal. No wheezing, rales or rhonchi. Abdominal: Soft, nondistended. Nontender. Bowel sounds active throughout. There are no masses palpable. No hepatomegaly. Rectal: Deferred Neurological: Alert and oriented to person place and time. Skin: Skin is warm and dry. No rashes noted.  Data Reviewed: I have personally reviewed following labs and imaging studies  CBC:    Latest Ref Rng & Units 12/29/2021    4:51 AM 12/28/2021   12:06 PM 12/28/2021    3:47 AM  CBC  WBC 4.0 - 10.5 K/uL 5.0   5.4   Hemoglobin 13.0 - 17.0 g/dL 7.7  7.9  7.3   Hematocrit 39.0 - 52.0 % 22.7  22.8  22.6   Platelets 150 - 400 K/uL 138   100     CMP:    Latest Ref Rng & Units 12/29/2021    4:51 AM 12/28/2021    3:47 AM 12/27/2021    4:23 AM  CMP  Glucose 70 - 99 mg/dL 865  784  696   BUN 8 - 23 mg/dL 22  32  37   Creatinine 0.61 - 1.24 mg/dL 2.95  2.84  1.32   Sodium 135 - 145 mmol/L 138  136  129   Potassium 3.5 - 5.1 mmol/L 3.2  3.2  3.6   Chloride 98 - 111 mmol/L 107  108  98   CO2 22 - 32 mmol/L 25  23  21    Calcium 8.9 - 10.3 mg/dL 8.4  8.0  7.9     GFR: CrCl cannot be calculated (Patient's most recent lab result is older than the maximum 21 days allowed.). Liver Function Tests: No results for input(s): "AST", "ALT", "ALKPHOS", "BILITOT", "PROT", "ALBUMIN" in the last 168 hours. No results for input(s): "LIPASE", "AMYLASE" in the last 168 hours. No results for input(s): "AMMONIA" in the last 168 hours. Coagulation Profile: No  results for input(s): "INR", "PROTIME" in the last 168 hours. HbA1C: No results for input(s): "HGBA1C" in the last 72 hours. Lipid Profile: No results for input(s): "CHOL", "HDL", "LDLCALC", "TRIG", "CHOLHDL", "LDLDIRECT" in the last 72 hours. Thyroid Function Tests: No results for input(s): "TSH", "T4TOTAL", "FREET4", "T3FREE", "THYROIDAB" in the last 72 hours. Anemia Panel: No results for input(s): "VITAMINB12", "FOLATE", "FERRITIN", "TIBC", "  IRON", "RETICCTPCT" in the last 72 hours.  No results found for this or any previous visit (from the past 240 hour(s)).    Radiology Studies: No results found.    Edman Circle, MD 05/18/2023, 2:42 PM  Cc: Hurshel Party, NP

## 2023-05-19 LAB — TISSUE TRANSGLUTAMINASE, IGA: (tTG) Ab, IgA: <1 U/mL

## 2023-05-19 LAB — IGA: Immunoglobulin A: 154 mg/dL (ref 70–320)

## 2023-05-31 ENCOUNTER — Other Ambulatory Visit (INDEPENDENT_AMBULATORY_CARE_PROVIDER_SITE_OTHER): Payer: Medicare Other

## 2023-05-31 DIAGNOSIS — D509 Iron deficiency anemia, unspecified: Secondary | ICD-10-CM

## 2023-05-31 LAB — HEMOCCULT SLIDES (X 3 CARDS)
Fecal Occult Blood: NEGATIVE
OCCULT 1: NEGATIVE
OCCULT 2: NEGATIVE
OCCULT 3: NEGATIVE
OCCULT 4: NEGATIVE
OCCULT 5: NEGATIVE

## 2023-07-07 ENCOUNTER — Ambulatory Visit: Payer: Medicare Other | Admitting: Gastroenterology

## 2023-07-07 ENCOUNTER — Encounter: Payer: Self-pay | Admitting: Gastroenterology

## 2023-07-07 VITALS — BP 165/73 | HR 66 | Temp 98.3°F | Resp 12 | Ht 70.0 in | Wt 221.2 lb

## 2023-07-07 DIAGNOSIS — D124 Benign neoplasm of descending colon: Secondary | ICD-10-CM | POA: Diagnosis not present

## 2023-07-07 DIAGNOSIS — K2950 Unspecified chronic gastritis without bleeding: Secondary | ICD-10-CM | POA: Diagnosis not present

## 2023-07-07 DIAGNOSIS — K640 First degree hemorrhoids: Secondary | ICD-10-CM

## 2023-07-07 DIAGNOSIS — K5730 Diverticulosis of large intestine without perforation or abscess without bleeding: Secondary | ICD-10-CM | POA: Diagnosis not present

## 2023-07-07 DIAGNOSIS — D509 Iron deficiency anemia, unspecified: Secondary | ICD-10-CM

## 2023-07-07 MED ORDER — SODIUM CHLORIDE 0.9 % IV SOLN: 500.0000 mL | INTRAVENOUS | Status: DC

## 2023-07-07 NOTE — Progress Notes (Signed)
Report to PACU, RN, vss, BBS= Clear.  

## 2023-07-07 NOTE — Progress Notes (Signed)
Chief Complaint: IDA  Referring Provider:  Hurshel Party, NP      ASSESSMENT AND PLAN;   #1. IDA #2. FH celiac (brother)   Plan: -CBC, CMP, celiac serology -Hemoccult cards x 3----- NEG -EGD/colon -If neg, CT AP with contrast followed by hematology workup.    I discussed EGD/Colonoscopy- the indications, risks, alternatives and potential complications including, but not limited to bleeding, infection, reaction to meds, damage to internal organs, cardiac and/or pulmonary problems, and perforation requiring surgery. The possibility that significant findings could be missed was explained. All ? were answered. Pt consents to proceed.     Latest Ref Rng & Units 05/18/2023    3:56 PM 12/29/2021    4:51 AM 12/28/2021   12:06 PM  CBC  WBC 4.0 - 10.5 K/uL 5.1  5.0    Hemoglobin 13.0 - 17.0 g/dL 16.1  7.7  7.9   Hematocrit 39.0 - 52.0 % 36.3  22.7  22.8   Platelets 150.0 - 400.0 K/uL 133.0  138     Hb 12- much better Plts still low. Celiac neg For EGD/colon today   HPI:    David Wu is a 67 y.o. male  CAD (Nl EF, neg cath July 2022), HLD, CKD 2, history of leukopenia, obesity, HTN, DM 2, asymptomatic cholelithiasis  With chronic IDA.  Here for repeat GI evaluation prior to hematology evaluation.  Hb 12, MCV95, plt 134. Cr 1.3 Sept 2024  No nausea, vomiting, heartburn, regurgitation, odynophagia or dysphagia.  No significant diarrhea or constipation.  No melena or hematochezia. No unintentional weight loss. No abdominal pain.  No H/O itching, skin lesions, easy bruisability, intake of OTC meds including diet pills, herbal medications, anabolic steroids or Tylenol. There is no H/O blood transfusions, IVDA or FH of liver disease. No jaundice, dark urine or pale stools. No alcohol abuse.  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 Hb drifted down to 7.3 s/p 2U to 7.7 with borderline platelet count 138K. Hemoglobin only  gradually improved to 12 (September 2024).  Advised to repeat GI workup.  No sodas, chocolates, chewing gums, artificial sweeteners and candy. No NSAIDs   Previous GI workup:  Colonoscopy 01/2015 ([facility]) for heme positive stools. Hb 14.4 -Mild sigmoid diverticulosis -Otherwise normal colonoscopy to TI. -Also had colonoscopy 07/2009: Mild sigmoid diverticulosis.  Otherwise normal to TI.  Negative biopsies for microscopic colitis.  EGD 04/30/2015 for history of anemia -Mild gastritis.  Negative biopsies for HP -Incidental gastric polyps s/p polypectomy x 2. Bx-fundic gland polyps. -Negative small bowel biopsies for celiac -EGD 04/2007-post bulbar duodenal ulcer.  Negative H. pylori.   CT AP 12/26/2021 1. 6.5 x 5.1 x 5.0 cm high density collection in the central pelvis just cranial to the prostate bed and posterior to the bladder/urethra is compatible with hematoma 2. High attenuation and mixed attenuation fluid is seen tracking up along the left pelvic sidewall and in the pre vesicle space compatible with small volume hemorrhage. Small volume extraperitoneal mixed attenuation hemorrhage tracks more cranially up anterior to the psoas muscles bilaterally. 3. Gas in the extraperitoneal soft tissues of the pelvic floor, within the anterior abdominal wall musculature, and in the preperitoneal space of the abdomen is consistent with recent surgery. There may be a trace amount of intraperitoneal gas in the region of the distal stomach 4. Cholelithiasis. 5. 5 mm nonobstructing right renal stone. 6. Bilateral renal cysts. 7. Aortic Atherosclerosis (ICD10-I70.0).  Past Medical History:  Diagnosis Date   Abnormal nuclear stress test 11/20/2019   Bleeding ulcer    BMI 27.0-27.9,adult    CAD (coronary artery disease) 11/20/2019   Chronic fatigue 04/22/2015   Diabetes (HCC)    Diabetes mellitus due to underlying condition with unspecified complications (HCC) 07/20/2019   Essential  hypertension 04/22/2015   Hearing loss    History of bleeding ulcers    History of kidney stones    Hyperlipidemia    Hypertension, essential, benign    Kidney stones    Mixed dyslipidemia 07/20/2019   Nonspecific abnormal electrocardiogram (ECG) (EKG) 07/20/2019   Prostate cancer (HCC) 11/2021   Shortness of breath 04/22/2015   Skin cancer    Stomach ulcer    Type 2 diabetes mellitus with stage 3 chronic kidney disease and hypertension (HCC)    Type 2 diabetes mellitus without complication (HCC) 04/22/2015    Past Surgical History:  Procedure Laterality Date   CATARACT EXTRACTION Left 2008   CATARACT EXTRACTION Right 2018   COLONOSCOPY  02/19/2015   Mild digmoid diverticulosis. 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   LYMPH NODE DISSECTION Bilateral 12/25/2021   Procedure: LYMPH NODE DISSECTION;  Surgeon: Sebastian Ache, MD;  Location: [facility];  Service: Urology;  Laterality: Bilateral;   ROBOT ASSISTED LAPAROSCOPIC RADICAL PROSTATECTOMY N/A 12/25/2021   Procedure: XI ROBOTIC ASSISTED LAPAROSCOPIC RADICAL PROSTATECTOMY WITH INDOCYANINE GREEN DYE;  Surgeon: Sebastian Ache, MD;  Location: [facility];  Service: Urology;  Laterality: N/A;    Family History  Problem Relation Age of Onset   Alzheimer's disease Mother    Hypertension Mother    Stroke Father    Hypertension Father    Celiac disease Brother    Hypertension Paternal Grandfather    Hyperlipidemia Son    Stomach cancer Maternal Great-grandmother    Colon cancer Neg Hx    Colon polyps Neg Hx    Rectal cancer Neg Hx    Esophageal cancer Neg Hx     Social History   Tobacco Use   Smoking status: Never   Smokeless tobacco: Never  Vaping Use   Vaping status: Never Used  Substance Use Topics   Alcohol  use: Never   Drug use: Never    Current Outpatient Medications  Medication Sig Dispense Refill   amLODipine-benazepril (LOTREL) 10-40 MG capsule Take 1 capsule by mouth daily.     atorvastatin (LIPITOR) 40 MG tablet Take 40 mg by mouth daily.     cloNIDine (CATAPRES) 0.1 MG tablet Take 0.1 mg by mouth 2 (two) times daily.     Coenzyme Q10 (CO Q-10) 100 MG CHEW Chew 100 mg by mouth daily.     Cyanocobalamin 2000 MCG TBCR Take 2,000 mcg by mouth once a week.     fenofibrate 54 MG tablet Take 54 mg by mouth daily.     glimepiride (AMARYL) 1 MG tablet Take 1 mg by mouth 2 (two) times daily.     hydrochlorothiazide (HYDRODIURIL) 25 MG tablet Take 25 mg by mouth daily.     labetalol (NORMODYNE) 200 MG tablet Take 400 mg by mouth 2 (two) times daily.     metFORMIN (GLUCOPHAGE) 500 MG tablet Take 500 mg by mouth 2 (two) times daily with a meal.     pioglitazone (  ACTOS) 45 MG tablet Take 45 mg by mouth daily.     Potassium Chloride ER 20 MEQ TBCR Take 20 mEq by mouth 2 (two) times daily.     nitroGLYCERIN (NITROSTAT) 0.4 MG SL tablet Place 0.4 mg under the tongue every 5 (five) minutes as needed for chest pain. (Patient not taking: Reported on 05/18/2023)     Vitamin D, Ergocalciferol, (DRISDOL) 1.25 MG (50000 UNIT) CAPS capsule Take 50,000 Units by mouth every 7 (seven) days. (Patient not taking: Reported on 07/07/2023)     Current Facility-Administered Medications  Medication Dose Route Frequency Provider Last Rate Last Admin   0.9 %  sodium chloride infusion  500 mL Intravenous Continuous Lynann Bologna, MD        No Known Allergies  Review of Systems:  Constitutional: Denies fever, chills, diaphoresis, appetite change and fatigue.  HEENT: Denies photophobia, eye pain, redness, hearing loss, ear pain, congestion, sore throat, rhinorrhea, sneezing, mouth sores, neck pain, neck stiffness and tinnitus.   Respiratory: Denies SOB, DOE, cough, chest tightness,  and wheezing.   Cardiovascular:  Denies chest pain, palpitations and leg swelling.  Genitourinary: Denies dysuria, urgency, frequency, hematuria, flank pain and difficulty urinating.  Musculoskeletal: Denies myalgias, back pain, joint swelling, arthralgias and gait problem.  Skin: No rash.  Neurological: Denies dizziness, seizures, syncope, weakness, light-headedness, numbness and headaches.  Hematological: Denies adenopathy. Easy bruising, personal or family bleeding history  Psychiatric/Behavioral: No anxiety or depression     Physical Exam:    BP (!) 149/66   Pulse 71   Temp 98.3 F (36.8 C) (Temporal)   Ht 5\' 10"  (1.778 m)   Wt 221 lb 3.2 oz (100.3 kg)   SpO2 100%   BMI 31.74 kg/m  Wt Readings from Last 3 Encounters:  07/07/23 221 lb 3.2 oz (100.3 kg)  05/18/23 221 lb 2 oz (100.3 kg)  12/25/21 216 lb 0.8 oz (98 kg)   Constitutional:  Well-developed, in no acute distress. Psychiatric: Normal mood and affect. Behavior is normal. HEENT: Pupils normal.  Conjunctivae are normal. No scleral icterus. Neck supple.  Cardiovascular: Normal rate, regular rhythm. No edema Pulmonary/chest: Effort normal and breath sounds normal. No wheezing, rales or rhonchi. Abdominal: Soft, nondistended. Nontender. Bowel sounds active throughout. There are no masses palpable. No hepatomegaly. Rectal: Deferred Neurological: Alert and oriented to person place and time. Skin: Skin is warm and dry. No rashes noted.  Data Reviewed: I have personally reviewed following labs and imaging studies  CBC:    Latest Ref Rng & Units 05/18/2023    3:56 PM 12/29/2021    4:51 AM 12/28/2021   12:06 PM  CBC  WBC 4.0 - 10.5 K/uL 5.1  5.0    Hemoglobin 13.0 - 17.0 g/dL 40.9  7.7  7.9   Hematocrit 39.0 - 52.0 % 36.3  22.7  22.8   Platelets 150.0 - 400.0 K/uL 133.0  138      CMP:    Latest Ref Rng & Units 05/18/2023    3:56 PM 12/29/2021    4:51 AM 12/28/2021    3:47 AM  CMP  Glucose 70 - 99 mg/dL 811  914  782   BUN 6 - 23 mg/dL 26  22   32   Creatinine 0.40 - 1.50 mg/dL 9.56  2.13  0.86   Sodium 135 - 145 mEq/L 141  138  136   Potassium 3.5 - 5.1 mEq/L 3.8  3.2  3.2   Chloride 96 - 112 mEq/L 104  107  108   CO2 19 - 32 mEq/L 26  25  23    Calcium 8.4 - 10.5 mg/dL 9.8  8.4  8.0   Total Protein 6.0 - 8.3 g/dL 6.7     Total Bilirubin 0.2 - 1.2 mg/dL 0.4     Alkaline Phos 39 - 117 U/L 46     AST 0 - 37 U/L 13     ALT 0 - 53 U/L 16           David Circle, MD 07/07/2023, 1:45 PM  Cc: Hurshel Party, NP

## 2023-07-07 NOTE — Op Note (Signed)
[facility] Patient Name: David Wu Procedure Date: 07/07/2023 1:42 PM MRN: [id number] Endoscopist: Lynann Bologna , MD, [id number] Age: 67 Referring MD:  Date of Birth: July 06, 1956 Gender: Male Account #: [id number] Procedure:                Colonoscopy Indications:              Iron deficiency anemia with heme-negative stools. Medicines:                Monitored Anesthesia Care Procedure:                Pre-Anesthesia Assessment:                           - Prior to the procedure, a History and Physical                            was performed, and patient medications and                            allergies were reviewed. The patient's tolerance of                            previous anesthesia was also reviewed. The risks                            and benefits of the procedure and the sedation                            options and risks were discussed with the patient.                            All questions were answered, and informed consent                            was obtained. Prior Anticoagulants: The patient has                            taken no anticoagulant or antiplatelet agents. ASA                            Grade Assessment: II - A patient with mild systemic                            disease. After reviewing the risks and benefits,                            the patient was deemed in satisfactory condition to                            undergo the procedure.                           After obtaining informed consent, the colonoscope  was passed under direct vision. Throughout the                            procedure, the patient's blood pressure, pulse, and                            oxygen saturations were monitored continuously. The                            CF [id number] #[id number] was introduced through the anus                            and advanced to the 2 cm into the ileum. The                             colonoscopy was performed without difficulty. The                            patient tolerated the procedure well. The quality                            of the bowel preparation was good. The terminal                            ileum, ileocecal valve, appendiceal orifice, and                            rectum were photographed. Scope In: 2:05:17 PM Scope Out: 2:21:10 PM Scope Withdrawal Time: 0 hours 10 minutes 20 seconds  Total Procedure Duration: 0 hours 15 minutes 53 seconds  Findings:                 Two sessile polyps were found in the proximal                            descending colon and mid descending colon. The                            polyps were 4 to 6 mm in size. These polyps were                            removed with a cold snare. Resection and retrieval                            were complete.                           A few small-mouthed diverticula were found in the                            sigmoid colon.                           Non-bleeding internal hemorrhoids were found. The  hemorrhoids were small and Grade I (internal                            hemorrhoids that do not prolapse). The retroflexed                            exam could not be performed because of small rectal                            vault.                           The exam was otherwise without abnormality on                            direct and retroflexion views.                           The terminal ileum appeared normal. Complications:            No immediate complications. Estimated Blood Loss:     Estimated blood loss: none. Impression:               - Two 4 to 6 mm polyps in the proximal descending                            colon and in the mid descending colon, removed with                            a cold snare. Resected and retrieved.                           - Mild sigmoid diverticulosis.                           - Non-bleeding internal  hemorrhoids.                           - The examination was otherwise normal on direct                            and retroflexion views.                           - The examined portion of the ileum was normal. Recommendation:           - Patient has a contact number available for                            emergencies. The signs and symptoms of potential                            delayed complications were discussed with the                            patient. Return to normal activities tomorrow.  Written discharge instructions were provided to the                            patient.                           - Resume previous diet.                           - Continue present medications.                           - Await pathology results.                           - Repeat colonoscopy for surveillance based on                            pathology results.                           - If still with problems, further workup.                           - The findings and recommendations were discussed                            with the patient's family. Lynann Bologna, MD 07/07/2023 2:30:30 PM This report has been signed electronically.

## 2023-07-07 NOTE — Patient Instructions (Signed)

## 2023-07-07 NOTE — Op Note (Addendum)
[facility] Patient Name: David Wu Procedure Date: 07/07/2023 1:42 PM MRN: [id number] Endoscopist: Lynann Bologna , MD, [id number] Age: 67 Referring MD:  Date of Birth: [birth year] Gender: Male Account #: [id number] Procedure:                Upper GI endoscopy Indications:              Iron deficiency anemia. FH celiac Medicines:                Monitored Anesthesia Care Procedure:                Pre-Anesthesia Assessment:                           - Prior to the procedure, a History and Physical                            was performed, and patient medications and                            allergies were reviewed. The patient's tolerance of                            previous anesthesia was also reviewed. The risks                            and benefits of the procedure and the sedation                            options and risks were discussed with the patient.                            All questions were answered, and informed consent                            was obtained. Prior Anticoagulants: The patient has                            taken no anticoagulant or antiplatelet agents. ASA                            Grade Assessment: II - A patient with mild systemic                            disease. After reviewing the risks and benefits,                            the patient was deemed in satisfactory condition to                            undergo the procedure.                           After obtaining informed consent, the endoscope was  passed under direct vision. Throughout the                            procedure, the patient's blood pressure, pulse, and                            oxygen saturations were monitored continuously. The                            GIF [id number] #[id number] was introduced through the                            mouth, and advanced to the second part of duodenum.                            The upper GI  endoscopy was accomplished without                            difficulty. The patient tolerated the procedure                            well. Scope In: Scope Out: Findings:                 The examined esophagus was normal.                           Localized mild inflammation characterized by                            erythema was found in the gastric antrum. Biopsies                            were taken with a cold forceps for histology.                           The examined duodenum was normal. Biopsies for                            histology were taken with a cold forceps for                            evaluation of celiac disease. Complications:            No immediate complications. Estimated Blood Loss:     Estimated blood loss: none. Impression:               - Mild gastritis. Recommendation:           - Patient has a contact number available for                            emergencies. The signs and symptoms of potential                            delayed complications were discussed with the  patient. Return to normal activities tomorrow.                            Written discharge instructions were provided to the                            patient.                           - Resume previous diet.                           - Continue present medications.                           - Await pathology results.                           - Proceed with colonoscopy.                           - The findings and recommendations were discussed                            with the patient's family. Lynann Bologna, MD 07/07/2023 2:27:01 PM This report has been signed electronically.

## 2023-07-07 NOTE — Progress Notes (Signed)
Called to room to assist during endoscopic procedure.  Patient ID and intended procedure confirmed with present staff. Received instructions for my participation in the procedure from the performing physician.  

## 2023-07-08 ENCOUNTER — Telehealth: Payer: Self-pay

## 2023-07-08 NOTE — Telephone Encounter (Signed)
  Follow up Call-     07/07/2023    1:33 PM  Call back number  Post procedure Call Back phone  # [phone]  Permission to leave phone message Yes     Patient questions:  Do you have a fever, pain , or abdominal swelling? No. Pain Score  0 *  Have you tolerated food without any problems? Yes.    Have you been able to return to your normal activities? Yes.    Do you have any questions about your discharge instructions: Diet   No. Medications  No. Follow up visit  No.  Do you have questions or concerns about your Care? No.  Actions: * If pain score is 4 or above: No action needed, pain <4.

## 2023-07-12 LAB — SURGICAL PATHOLOGY

## 2023-07-17 ENCOUNTER — Encounter: Payer: Self-pay | Admitting: Gastroenterology

## 2023-08-26 ENCOUNTER — Encounter: Payer: Self-pay | Admitting: Oncology

## 2023-08-26 ENCOUNTER — Inpatient Hospital Stay: Payer: Medicare Other

## 2023-08-26 ENCOUNTER — Inpatient Hospital Stay: Payer: Medicare Other | Attending: Oncology | Admitting: Oncology

## 2023-08-26 VITALS — BP 151/77 | HR 79 | Temp 98.3°F | Resp 18 | Ht 72.05 in | Wt 219.6 lb

## 2023-08-26 DIAGNOSIS — D649 Anemia, unspecified: Secondary | ICD-10-CM

## 2023-08-26 DIAGNOSIS — Z8546 Personal history of malignant neoplasm of prostate: Secondary | ICD-10-CM | POA: Diagnosis not present

## 2023-08-26 DIAGNOSIS — D696 Thrombocytopenia, unspecified: Secondary | ICD-10-CM

## 2023-08-26 DIAGNOSIS — C61 Malignant neoplasm of prostate: Secondary | ICD-10-CM

## 2023-08-26 DIAGNOSIS — D509 Iron deficiency anemia, unspecified: Secondary | ICD-10-CM | POA: Diagnosis not present

## 2023-08-26 DIAGNOSIS — N1831 Chronic kidney disease, stage 3a: Secondary | ICD-10-CM | POA: Diagnosis not present

## 2023-08-26 LAB — TECHNOLOGIST SMEAR REVIEW

## 2023-08-26 LAB — CBC WITH DIFFERENTIAL (CANCER CENTER ONLY)
Abs Immature Granulocytes: 0.01 10*3/uL (ref 0.00–0.07)
Basophils Absolute: 0.1 10*3/uL (ref 0.0–0.1)
Basophils Relative: 1 %
Eosinophils Absolute: 0.2 10*3/uL (ref 0.0–0.5)
Eosinophils Relative: 5 %
HCT: 34.6 % — ABNORMAL LOW (ref 39.0–52.0)
Hemoglobin: 11.5 g/dL — ABNORMAL LOW (ref 13.0–17.0)
Immature Granulocytes: 0 %
Immature Platelet Fraction: 0.9 % — ABNORMAL LOW (ref 1.2–8.6)
Lymphocytes Relative: 18 %
Lymphs Abs: 0.8 10*3/uL (ref 0.7–4.0)
MCH: 30.3 pg (ref 26.0–34.0)
MCHC: 33.2 g/dL (ref 30.0–36.0)
MCV: 91.1 fL (ref 80.0–100.0)
Monocytes Absolute: 0.4 10*3/uL (ref 0.1–1.0)
Monocytes Relative: 9 %
Neutro Abs: 2.9 10*3/uL (ref 1.7–7.7)
Neutrophils Relative %: 67 %
Platelet Count: 145 10*3/uL — ABNORMAL LOW (ref 150–400)
RBC: 3.8 MIL/uL — ABNORMAL LOW (ref 4.22–5.81)
RDW: 15.1 % (ref 11.5–15.5)
WBC Count: 4.3 10*3/uL (ref 4.0–10.5)
nRBC: 0 % (ref 0.0–0.2)
nRBC: 0 /100{WBCs}

## 2023-08-26 LAB — CMP (CANCER CENTER ONLY)
ALT: 16 U/L (ref 0–44)
AST: 18 U/L (ref 15–41)
Albumin: 4.1 g/dL (ref 3.5–5.0)
Alkaline Phosphatase: 47 U/L (ref 38–126)
Anion gap: 12 (ref 5–15)
BUN: 24 mg/dL — ABNORMAL HIGH (ref 8–23)
CO2: 25 mmol/L (ref 22–32)
Calcium: 9.8 mg/dL (ref 8.9–10.3)
Chloride: 103 mmol/L (ref 98–111)
Creatinine: 1.54 mg/dL — ABNORMAL HIGH (ref 0.61–1.24)
GFR, Estimated: 49 mL/min — ABNORMAL LOW (ref >60)
Glucose, Bld: 180 mg/dL — ABNORMAL HIGH (ref 70–99)
Potassium: 3.5 mmol/L (ref 3.5–5.1)
Sodium: 141 mmol/L (ref 135–145)
Total Bilirubin: 0.4 mg/dL (ref 0.0–1.2)
Total Protein: 6.5 g/dL (ref 6.5–8.1)

## 2023-08-26 LAB — IRON AND TIBC
Iron: 123 ug/dL (ref 45–182)
Saturation Ratios: 30 % (ref 17.9–39.5)
TIBC: 414 ug/dL (ref 250–450)
UIBC: 291 ug/dL

## 2023-08-26 LAB — VITAMIN B12: Vitamin B-12: 1996 pg/mL — ABNORMAL HIGH (ref 180–914)

## 2023-08-26 LAB — FOLATE: Folate: 33 ng/mL (ref >5.9)

## 2023-08-26 LAB — FERRITIN: Ferritin: 35 ng/mL (ref 24–336)

## 2023-08-26 NOTE — Progress Notes (Signed)
[facility]    CONSULT NOTE  REFERRING PHYSICIAN:    REASON FOR CONSULTATION: Anemia  HPI: David Wu is a 68 y.o. male with a past medical history significant for hypertension, stage 3a CKD, diabetes mellitus, hyperlipidemia, history of prostate cancer, history of stomach ulcer.  He has been referred to hematology for evaluation of anemia.  His most recent lab work available to me was performed on 08/22/2023 which showed a hemoglobin of 11.4, MCV 94, WBC 4.0, platelet count 141,000, BUN 26, creatinine 1.49, GFR 50. Normal T. bili, alk phos, AST, ALT.  Review of his prior lab work shows that he has been anemic since at least May 2022.  Hemoglobin was as low as 7.7 on 12/29/2021.  He has had persistently low platelets since at least February 2021.  He was seen by GI for the iron deficiency anemia.  Stool for occult blood was negative x 3.  EGD and colonoscopy were both performed on 07/07/2023.  EGD showed mild gastritis.  Colonoscopy showed 2 sessile polyps in the proximal descending colon and mid descending colon, a few small mouth diverticula found in the sigmoid colon, nonbleeding internal hemorrhoids, terminal ileum appeared normal.  CT of the chest/abdomen/pelvis without contrast was performed on 12/26/2021 which did not show any evidence of splenomegaly, hepatomegaly, fatty liver disease, cirrhosis.  With regard to his history of prostate cancer, he underwent a robotic assisted laparoscopic radical prostatectomy with bilateral pelvic lymphadenopathy on 12/25/2021 by Dr. Sebastian Ache.  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  All lymph nodes were negative.  His postop course was complicated by pelvic hematoma.  Hemoglobin drifted down to 7.3.  He received 2 units PRBCs at that time.  The patient presented to the office today accompanied by his wife.  He reports that he is experiencing fatigue to the point that  he is not really do much other than rest most of the day.  He has dyspnea on exertion and does report some pica for ice.  His wife reports that he is having some episodes of lightheadedness.  He currently denies headaches, chest pain, shortness of breath at rest, abdominal pain, nausea, vomiting.  He noticed a small amount of blood this morning from his rectum.  He has known hemorrhoids.  He denies epistaxis, hemoptysis, hematemesis, hematuria, melena.  He has never taken oral iron and has never received IV iron.  He reports receiving blood transfusions at least 2 occasions.  The most recent one was following his prostate surgery.  Previously, he received transfusion around the time that he had a bleeding GI ulcer.  He reports a good appetite and has not had any unintentional weight loss.  No persistent bone pain.  Past Medical History:  Diagnosis Date   Abnormal nuclear stress test 11/20/2019   Bleeding ulcer    BMI 27.0-27.9,adult    CAD (coronary artery disease) 11/20/2019   Chronic fatigue 04/22/2015   Diabetes (HCC)    Diabetes mellitus due to underlying condition with unspecified complications (HCC) 07/20/2019   Essential hypertension 04/22/2015   Hearing loss    History of bleeding ulcers    History of kidney stones    Hyperlipidemia    Hypertension, essential, benign    Kidney stones    Mixed dyslipidemia 07/20/2019   Nonspecific abnormal electrocardiogram (ECG) (EKG) 07/20/2019   Prostate cancer (HCC) 11/2021   Shortness of breath 04/22/2015   Skin cancer  Stomach ulcer    Type 2 diabetes mellitus with stage 3 chronic kidney disease and hypertension (HCC)    Type 2 diabetes mellitus without complication (HCC) 04/22/2015  :  Past Surgical History:  Procedure Laterality Date   CATARACT EXTRACTION Left 2008   CATARACT EXTRACTION Right 2018   COLONOSCOPY  02/19/2015   Mild digmoid diverticulosis. 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     atorvastatin (LIPITOR) 40 MG tablet Take 40 mg by mouth daily.     cloNIDine (CATAPRES) 0.1 MG tablet Take 0.1 mg by mouth 2 (two) times daily.     Coenzyme Q10 (CO Q-10) 100 MG CHEW Chew 100 mg by mouth daily.     Cyanocobalamin 2000 MCG TBCR Take 2,000 mcg by mouth once a week.     fenofibrate 54 MG tablet Take 54 mg by mouth daily.     glimepiride (AMARYL) 1 MG tablet Take 1 mg by mouth 2 (two) times daily.     hydrochlorothiazide (HYDRODIURIL) 25 MG tablet Take 25 mg by mouth daily.     labetalol (NORMODYNE) 200 MG tablet Take 400 mg by mouth 2 (two) times daily.     metFORMIN (GLUCOPHAGE) 500 MG tablet Take 500 mg by mouth 2 (two) times daily with a meal.     nitroGLYCERIN (NITROSTAT) 0.4 MG SL tablet Place 0.4 mg under the tongue every 5 (five) minutes as needed for chest pain. (Patient not taking: Reported on 05/18/2023)     pioglitazone (ACTOS) 45 MG tablet Take 45 mg by mouth daily.     Potassium Chloride ER 20 MEQ TBCR Take 20 mEq by mouth 2 (two) times daily.     Vitamin D, Ergocalciferol, (DRISDOL) 1.25 MG (50000 UNIT) CAPS capsule Take 50,000 Units by mouth every 7 (seven) days. (Patient not taking: Reported on 07/07/2023)      No current facility-administered medications for this visit.    No Known Allergies:   Family History  Problem Relation Age of Onset   Alzheimer's disease Mother    Hypertension Mother    Stroke Father    Hypertension Father    Celiac disease Brother    Hypertension Paternal Grandfather    Hyperlipidemia Son    Stomach cancer Maternal Great-grandmother    Colon cancer Neg Hx    Colon polyps Neg Hx    Rectal cancer Neg Hx    Esophageal cancer Neg Hx   :   Social History   Socioeconomic History   Marital status: Married    Spouse name: Not on file   Number of children: 1   Years of education: Not on file   Highest education level: 12th grade  Occupational History   Occupation: retired  Tobacco Use  Smoking status: Never   Smokeless tobacco: Never  Vaping Use   Vaping status: Never Used  Substance and Sexual Activity   Alcohol use: Never   Drug use: Never   Sexual activity: Not on file  Other Topics Concern   Not on file  Social History Narrative   Not on file   Social Drivers of Health   Financial Resource Strain: Not on file  Food Insecurity: Not on file  Transportation Needs: Not on file  Physical Activity: Not on file  Stress: Not on file  Social Connections: Not on file  Intimate Partner Violence: Not on file  :  REVIEW OF SYSTEMS:   Constitutional: Reports fatigue. Negative for appetite change, chills, fever and unexpected weight change.  HENT:   Negative for mouth sores, nosebleeds, sore throat and trouble swallowing.   Eyes: Negative for eye problems and icterus.  Respiratory: Reports dyspnea on exertion. Negative for cough, hemoptysis, shortness of breath at rest and wheezing.   Cardiovascular: Negative for chest pain and leg swelling.  Gastrointestinal: Negative for abdominal pain, constipation, diarrhea, nausea and vomiting.  Genitourinary: Negative for bladder incontinence, difficulty urinating, dysuria, frequency and hematuria.    Musculoskeletal: Negative for back pain, gait problem, neck pain and neck stiffness.  Skin: Negative for itching and rash.  Neurological: He has had some episodes of lightheadedness. Negative for extremity weakness, gait problem, headaches, and seizures.  Hematological: Negative for adenopathy. Does not bruise/bleed easily.  Psychiatric/Behavioral: Negative for confusion, depression and sleep disturbance. The patient is not nervous/anxious.     PHYSICAL EXAMINATION: Blood pressure (!) 151/77, pulse 79, temperature 98.3 F (36.8 C), temperature source Oral, resp. rate 18, height 6' 0.05" (1.83 m), weight 99.6 kg, SpO2 100%.  Physical Exam  Constitutional: Oriented to person, place, and time and well-developed, well-nourished, and in no distress. No distress.  HENT:  Head: Normocephalic and atraumatic.  Mouth/Throat: Oropharynx is clear and moist. No oropharyngeal exudate.  Eyes: Conjunctivae are normal. Right eye exhibits no discharge. Left eye exhibits no discharge. No scleral icterus.  Neck: Normal range of motion. Neck supple.  Cardiovascular: Normal rate, regular rhythm, normal heart sounds and intact distal pulses.   Pulmonary/Chest: Effort normal and breath sounds normal. No respiratory distress. No wheezes. No rales.  Abdominal: Soft. Bowel sounds are normal. Exhibits no distension and no mass. There is no tenderness.  Musculoskeletal: Normal range of motion. Exhibits no edema.  Lymphadenopathy:    No cervical adenopathy.  Neurological: Alert and oriented to person, place, and time. Exhibits normal muscle tone. Gait normal. Coordination normal.  Skin: Skin is warm and dry. No rash noted. Not diaphoretic. No erythema. No pallor.  Psychiatric: Mood, memory and judgment normal.  Vitals reviewed.    LABS:  Lab Results  Component Value Date   WBC 4.3 08/26/2023   HGB 11.5 (L) 08/26/2023   HCT 34.6 (L) 08/26/2023   PLT 145 (L) 08/26/2023   GLUCOSE 180 (H) 08/26/2023   CHOL 126  10/04/2019   TRIG 128 10/04/2019   HDL 38 (L) 10/04/2019   LDLCALC 65 10/04/2019   ALT 16 08/26/2023   AST 18 08/26/2023   NA 141 08/26/2023   K 3.5 08/26/2023   CL 103 08/26/2023   CREATININE 1.54 (H) 08/26/2023   BUN 24 (H) 08/26/2023   CO2 25 08/26/2023   HGBA1C 6.9 (H) 12/14/2021    No results found.  ASSESSMENT: Normocytic anemia Thrombocytopenia Stage 3a CKD History of prostate adenocarcinoma, Gleason score 3+4 equal 7 (  grade group 2) pT2. S/p radical prostatectomy  PLAN:   Mr. Amorin has normocytic anemia.  He has a history of a hematoma following prostate surgery as well as a remote history of bleeding GI ulcers.  He has required blood transfusions in the past.  Currently, he has no significant bleeding with the exception of occasional bleeding hemorrhoids.  We discussed potential causes for anemia including iron deficiency and other nutritional deficiencies such as B12, folate, copper.  We also discussed that he has known stage III CKD secondary to diabetes which can also contribute to anemia.  Other causes of anemia include hemolysis but less likely given normal bilirubin and possible underlying bone marrow disorder.  For the anemia, will obtain additional workup today including a CBC with differential, CMP, ferritin, iron studies.  Will make recommendations pending workup.  If this workup is unrevealing, would consider obtaining SPEP and light chains.  Today, we also discussed the fact that he has had mild thrombocytopenia for a number of years.  Potential etiologies include B12, folate, copper deficiency and these levels are being checked today.  Review of peripheral blood smear has also been requested.  Previous imaging of the abdomen did not show any evidence of splenomegaly or liver disease.  However, would consider repeat abdominal imaging if current workup is unrevealing.   He is currently being followed by urology very closely for his history of prostate cancer.   He has a follow-up visit with urology in the next few months.  The patient tells me that his PSA is being monitored closely and will defer to urology for ongoing management of the history of prostate cancer.  The patient was advised to call us prior to his next visit should he have any questions or concerns.  Follow-up visit has been scheduled for 2 to 3 weeks to discuss the findings and consider additional workup if indicated.

## 2023-08-29 LAB — COPPER, SERUM: Copper: 90 ug/dL (ref 69–132)

## 2023-08-31 ENCOUNTER — Telehealth: Payer: Self-pay

## 2023-08-31 MED ORDER — TAB-A-VITE/IRON PO TABS: 1.0000 | ORAL_TABLET | Freq: Every day | ORAL | Status: AC

## 2023-08-31 NOTE — Telephone Encounter (Signed)
-----   Message from Adah Perl sent at 08/31/2023  8:39 AM EST ----- Please let him know his iron stores are mildly low, recommend he take a multivitamin with iron daily-check with the pharmacist. Keep appt in February, thanks.

## 2023-08-31 NOTE — Telephone Encounter (Signed)
Patient has been advised and will keep follow. Multivitamin with iron has been added to medication list.

## 2023-09-02 ENCOUNTER — Other Ambulatory Visit: Payer: Self-pay | Admitting: Hematology and Oncology

## 2023-09-02 ENCOUNTER — Telehealth: Payer: Self-pay

## 2023-09-02 DIAGNOSIS — D649 Anemia, unspecified: Secondary | ICD-10-CM

## 2023-09-02 DIAGNOSIS — D696 Thrombocytopenia, unspecified: Secondary | ICD-10-CM

## 2023-09-02 NOTE — Telephone Encounter (Signed)
-----   Message from Adah Perl sent at 09/02/2023  8:13 AM EST ----- Please let him know vitamin levels were normal. Keep appt as scheduled. Thanks

## 2023-09-02 NOTE — Telephone Encounter (Signed)
 Detailed message left for patient.

## 2023-09-08 NOTE — Progress Notes (Cosign Needed)
 [facility]  [address] [place],  [place]  [address] [phone]  Clinic Day:  09/09/2023  Referring physician: Erick Greig LABOR, NP   HISTORY OF PRESENT ILLNESS:  The patient is a 68 y.o. male with anemia of uncertain etiology.  Initial evaluation did not reveal definite nutritional deficiency, although his ferritin was low normal.  He has a previous history of B12 deficiency, for which he continues oral B12.  He also has had chronic mild thrombocytopenia and previously saw Dr. Ezzard in March 2020 for this.  No specific etiology was elicited, so follow-up with his PCP was recommended.  In December 2024, EGD showed mild gastritis and colonoscopy showed nonbleeding internal hemorrhoids.  He is here today for repeat clinical assessment and reports fatigue and mild shortness of breath with exertion.  He denies any overt form of blood loss.  He is accompanied by his wife today who states he is not able to be as active as previous.  Of note, he has a history of stage I (pT2 pN0 M0), Gleason 7 (3+4) prostate cancer treated with robotic radical prostatectomy in May 2023.  He had mild anemia prior to his surgery in March 2023.  His hemoglobin was as low as 7.7 postoperatively.  He continues to follow with Dr. Alvaro and states his last PSA was 0.04.   PHYSICAL EXAM:  Blood pressure (!) 170/73, pulse 64, temperature 97.8 F (36.6 C), temperature source Oral, resp. rate 18, height 6' 0.05 (1.83 m), weight 219 lb 4.8 oz (99.5 kg), SpO2 100%. Wt Readings from Last 3 Encounters:  09/09/23 219 lb 4.8 oz (99.5 kg)  08/26/23 219 lb 9.6 oz (99.6 kg)  07/07/23 221 lb 3.2 oz (100.3 kg)   Body mass index is 29.7 kg/m.  Performance status (ECOG): 1 - Symptomatic but completely ambulatory  Physical Exam Vitals and nursing note reviewed.  Constitutional:      General: He is not in acute distress.    Appearance: Normal appearance. He is normal weight.  HENT:     Head: Normocephalic and  atraumatic.     Mouth/Throat:     Mouth: Mucous membranes are moist.     Pharynx: Oropharynx is clear. No oropharyngeal exudate or posterior oropharyngeal erythema.  Eyes:     General: No scleral icterus.    Extraocular Movements: Extraocular movements intact.     Conjunctiva/sclera: Conjunctivae normal.     Pupils: Pupils are equal, round, and reactive to light.  Cardiovascular:     Rate and Rhythm: Normal rate and regular rhythm.     Heart sounds: Normal heart sounds. No murmur heard.    No friction rub. No gallop.  Pulmonary:     Effort: Pulmonary effort is normal.     Breath sounds: Normal breath sounds. No wheezing, rhonchi or rales.  Abdominal:     General: Bowel sounds are normal. There is no distension.     Palpations: Abdomen is soft. There is no hepatomegaly, splenomegaly or mass.     Tenderness: There is no abdominal tenderness.  Musculoskeletal:        General: Normal range of motion.     Cervical back: Normal range of motion and neck supple. No tenderness.     Right lower leg: No edema.     Left lower leg: No edema.  Lymphadenopathy:     Cervical: No cervical adenopathy.     Upper Body:     Right upper body: No supraclavicular or axillary adenopathy.  Left upper body: No supraclavicular or axillary adenopathy.     Lower Body: No right inguinal adenopathy. No left inguinal adenopathy.  Skin:    General: Skin is warm and dry.     Coloration: Skin is not jaundiced.     Findings: No rash.  Neurological:     Mental Status: He is alert and oriented to person, place, and time.     Cranial Nerves: No cranial nerve deficit.  Psychiatric:        Mood and Affect: Mood normal.        Behavior: Behavior normal.        Thought Content: Thought content normal.     LABS:      Latest Ref Rng & Units 09/09/2023    9:57 AM 08/26/2023   11:53 AM 05/18/2023    3:56 PM  CBC  WBC 4.0 - 10.5 K/uL 3.4  4.3  5.1   Hemoglobin 13.0 - 17.0 g/dL 87.8  88.4  87.8   Hematocrit  39.0 - 52.0 % 36.0  34.6  36.3   Platelets 150 - 400 K/uL 126  145  133.0       Latest Ref Rng & Units 08/26/2023   11:53 AM 05/18/2023    3:56 PM 12/29/2021    4:51 AM  CMP  Glucose 70 - 99 mg/dL 819  816  764   BUN 8 - 23 mg/dL 24  26  22    Creatinine 0.61 - 1.24 mg/dL 8.45  8.29  8.55   Sodium 135 - 145 mmol/L 141  141  138   Potassium 3.5 - 5.1 mmol/L 3.5  3.8  3.2   Chloride 98 - 111 mmol/L 103  104  107   CO2 22 - 32 mmol/L 25  26  25    Calcium  8.9 - 10.3 mg/dL 9.8  9.8  8.4   Total Protein 6.5 - 8.1 g/dL 6.5  6.7    Total Bilirubin 0.0 - 1.2 mg/dL 0.4  0.4    Alkaline Phos 38 - 126 U/L 47  46    AST 15 - 41 U/L 18  13    ALT 0 - 44 U/L 16  16       Lab Results  Component Value Date   TIBC 414 08/26/2023   FERRITIN 35 08/26/2023   IRONPCTSAT 30 08/26/2023   No results found for: LDH     Component Value Date/Time   IGASERUM 154 05/18/2023 1556    Review Flowsheet       Latest Ref Rng & Units 05/18/2023 08/26/2023  Oncology Labs  Ferritin 24 - 336 ng/mL - 35   %SAT 17.9 - 39.5 % - 30   Immunoglobulin A 70 - 320 mg/dL 845  -     STUDIES:  No results found.    ASSESSMENT & PLAN:   Assessment/Plan:  68 y.o. male who now has pancytopenia, which is more concerning for a bone marrow disorder. Additional evaluation is pending from today.  I will have him see Dr. Ezzard in 2 weeks for further recommendations.  The patient understands all the plans discussed today and is in agreement with them.  He knows to contact our office if he develops concerns prior to his next appointment.     Andrez DELENA Foy, PA-C   Physician Assistant [facility] [place] [phone]

## 2023-09-09 ENCOUNTER — Encounter: Payer: Self-pay | Admitting: Hematology and Oncology

## 2023-09-09 ENCOUNTER — Inpatient Hospital Stay: Payer: Medicare Other | Attending: Oncology | Admitting: Hematology and Oncology

## 2023-09-09 ENCOUNTER — Telehealth: Payer: Self-pay

## 2023-09-09 ENCOUNTER — Inpatient Hospital Stay: Payer: Medicare Other

## 2023-09-09 ENCOUNTER — Telehealth: Payer: Self-pay | Admitting: Hematology and Oncology

## 2023-09-09 VITALS — BP 170/73 | HR 64 | Temp 97.8°F | Resp 18 | Ht 72.05 in | Wt 219.3 lb

## 2023-09-09 DIAGNOSIS — D709 Neutropenia, unspecified: Secondary | ICD-10-CM

## 2023-09-09 DIAGNOSIS — D649 Anemia, unspecified: Secondary | ICD-10-CM | POA: Diagnosis present

## 2023-09-09 DIAGNOSIS — Z8546 Personal history of malignant neoplasm of prostate: Secondary | ICD-10-CM | POA: Insufficient documentation

## 2023-09-09 DIAGNOSIS — K2970 Gastritis, unspecified, without bleeding: Secondary | ICD-10-CM | POA: Insufficient documentation

## 2023-09-09 DIAGNOSIS — D696 Thrombocytopenia, unspecified: Secondary | ICD-10-CM

## 2023-09-09 DIAGNOSIS — D61818 Other pancytopenia: Secondary | ICD-10-CM | POA: Diagnosis not present

## 2023-09-09 DIAGNOSIS — Z9079 Acquired absence of other genital organ(s): Secondary | ICD-10-CM | POA: Diagnosis not present

## 2023-09-09 LAB — RETICULOCYTES
Immature Retic Fract: 11.5 % (ref 2.3–15.9)
RBC.: 3.9 MIL/uL — ABNORMAL LOW (ref 4.22–5.81)
Retic Count, Absolute: 67.5 10*3/uL (ref 19.0–186.0)
Retic Ct Pct: 1.7 % (ref 0.4–3.1)

## 2023-09-09 LAB — CBC WITH DIFFERENTIAL (CANCER CENTER ONLY)
Abs Immature Granulocytes: 0.01 10*3/uL (ref 0.00–0.07)
Basophils Absolute: 0 10*3/uL (ref 0.0–0.1)
Basophils Relative: 1 %
Eosinophils Absolute: 0.2 10*3/uL (ref 0.0–0.5)
Eosinophils Relative: 6 %
HCT: 36 % — ABNORMAL LOW (ref 39.0–52.0)
Hemoglobin: 12.1 g/dL — ABNORMAL LOW (ref 13.0–17.0)
Immature Granulocytes: 0 %
Immature Platelet Fraction: 1 % — ABNORMAL LOW (ref 1.2–8.6)
Lymphocytes Relative: 22 %
Lymphs Abs: 0.8 10*3/uL (ref 0.7–4.0)
MCH: 30.5 pg (ref 26.0–34.0)
MCHC: 33.6 g/dL (ref 30.0–36.0)
MCV: 90.7 fL (ref 80.0–100.0)
Monocytes Absolute: 0.5 10*3/uL (ref 0.1–1.0)
Monocytes Relative: 14 %
Neutro Abs: 1.9 10*3/uL (ref 1.7–7.7)
Neutrophils Relative %: 57 %
Platelet Count: 126 10*3/uL — ABNORMAL LOW (ref 150–400)
RBC: 3.97 MIL/uL — ABNORMAL LOW (ref 4.22–5.81)
RDW: 15.2 % (ref 11.5–15.5)
WBC Count: 3.4 10*3/uL — ABNORMAL LOW (ref 4.0–10.5)
nRBC: 0 % (ref 0.0–0.2)
nRBC: 0 /100{WBCs}

## 2023-09-09 LAB — DIRECT ANTIGLOBULIN TEST (NOT AT ARMC)
DAT, IgG: NEGATIVE
DAT, complement: NEGATIVE

## 2023-09-09 LAB — TSH: TSH: 6.28 u[IU]/mL — ABNORMAL HIGH (ref 0.350–4.500)

## 2023-09-09 LAB — LACTATE DEHYDROGENASE: LDH: 186 U/L (ref 98–192)

## 2023-09-09 NOTE — Telephone Encounter (Signed)
-----   Message from Alfonso Ike sent at 09/09/2023  2:10 PM EST ----- Please fax his labs from today to [facility] and ask her nurse if she will address the TSH. Thanks

## 2023-09-09 NOTE — Telephone Encounter (Signed)
 Labs faxed and message left with Amy Moons office to please review spoke with her nurse Fairy Homer.

## 2023-09-09 NOTE — Telephone Encounter (Signed)
 09/09/23 Spoke with patient and confirmed next appt.

## 2023-09-11 LAB — SOLUBLE TRANSFERRIN RECEPTOR: Transferrin Receptor: 17 nmol/L (ref 12.2–27.3)

## 2023-09-11 LAB — HAPTOGLOBIN: Haptoglobin: 121 mg/dL (ref 32–363)

## 2023-09-13 LAB — PROTEIN ELECTROPHORESIS, SERUM
A/G Ratio: 1.3 (ref 0.7–1.7)
Albumin ELP: 3.5 g/dL (ref 2.9–4.4)
Alpha-1-Globulin: 0.3 g/dL (ref 0.0–0.4)
Alpha-2-Globulin: 0.8 g/dL (ref 0.4–1.0)
Beta Globulin: 1 g/dL (ref 0.7–1.3)
Gamma Globulin: 0.7 g/dL (ref 0.4–1.8)
Globulin, Total: 2.8 g/dL (ref 2.2–3.9)
Total Protein ELP: 6.3 g/dL (ref 6.0–8.5)

## 2023-09-22 ENCOUNTER — Other Ambulatory Visit: Payer: Self-pay

## 2023-09-22 DIAGNOSIS — C61 Malignant neoplasm of prostate: Secondary | ICD-10-CM

## 2023-09-22 NOTE — Progress Notes (Unsigned)
[facility]  [address] [place],  [place]  [address] [phone]  Clinic Day:  09/23/2023  Referring physician: Hurshel Party, NP   HISTORY OF PRESENT ILLNESS:  The patient is a 68 y.o. male with anemia of uncertain etiology.  He comes in today to reassess his anemia, as well as his other peripheral counts.  Of note, recent labs have not shown any nutritional deficiencies factoring into his anemia.  He denies having any overt forms of blood loss.  However, he does have a steady level of fatigue.  Of note, he has a history of stage I (pT2 pN0 M0), Gleason 7 (3+4) prostate cancer treated with robotic radical prostatectomy in May 2023.  He had mild anemia prior to his surgery in March 2023.    PHYSICAL EXAM:  Blood pressure (!) 151/69, pulse 72, temperature 98.7 F (37.1 C), temperature source Oral, resp. rate 16, height 6' 0.05" (1.83 m), weight 219 lb 12.8 oz (99.7 kg), SpO2 100%. Wt Readings from Last 3 Encounters:  09/23/23 219 lb 12.8 oz (99.7 kg)  09/09/23 219 lb 4.8 oz (99.5 kg)  08/26/23 219 lb 9.6 oz (99.6 kg)   Body mass index is 29.77 kg/m.  Performance status (ECOG): 1 - Symptomatic but completely ambulatory  Physical Exam Vitals and nursing note reviewed.  Constitutional:      General: He is not in acute distress.    Appearance: Normal appearance. He is normal weight.  HENT:     Head: Normocephalic and atraumatic.     Mouth/Throat:     Mouth: Mucous membranes are moist.     Pharynx: Oropharynx is clear. No oropharyngeal exudate or posterior oropharyngeal erythema.  Eyes:     General: No scleral icterus.    Extraocular Movements: Extraocular movements intact.     Conjunctiva/sclera: Conjunctivae normal.     Pupils: Pupils are equal, round, and reactive to light.  Cardiovascular:     Rate and Rhythm: Normal rate and regular rhythm.     Heart sounds: Normal heart sounds. No murmur heard.    No friction rub. No gallop.  Pulmonary:      Effort: Pulmonary effort is normal.     Breath sounds: Normal breath sounds. No wheezing, rhonchi or rales.  Abdominal:     General: Bowel sounds are normal. There is no distension.     Palpations: Abdomen is soft. There is no hepatomegaly, splenomegaly or mass.     Tenderness: There is no abdominal tenderness.  Musculoskeletal:        General: Normal range of motion.     Cervical back: Normal range of motion and neck supple. No tenderness.     Right lower leg: No edema.     Left lower leg: No edema.  Lymphadenopathy:     Cervical: No cervical adenopathy.     Upper Body:     Right upper body: No supraclavicular or axillary adenopathy.     Left upper body: No supraclavicular or axillary adenopathy.     Lower Body: No right inguinal adenopathy. No left inguinal adenopathy.  Skin:    General: Skin is warm and dry.     Coloration: Skin is not jaundiced.     Findings: No rash.  Neurological:     Mental Status: He is alert and oriented to person, place, and time.     Cranial Nerves: No cranial nerve deficit.  Psychiatric:        Mood and Affect: Mood normal.  Behavior: Behavior normal.        Thought Content: Thought content normal.     LABS:      Latest Ref Rng & Units 09/23/2023   10:22 AM 09/09/2023    9:57 AM 08/26/2023   11:53 AM  CBC  WBC 4.0 - 10.5 K/uL 3.7  3.4  4.3   Hemoglobin 13.0 - 17.0 g/dL 40.9  81.1  91.4   Hematocrit 39.0 - 52.0 % 33.3  36.0  34.6   Platelets 150 - 400 K/uL 124  126  145       Latest Ref Rng & Units 08/26/2023   11:53 AM 05/18/2023    3:56 PM 12/29/2021    4:51 AM  CMP  Glucose 70 - 99 mg/dL 782  956  213   BUN 8 - 23 mg/dL 24  26  22    Creatinine 0.61 - 1.24 mg/dL 0.86  5.78  4.69   Sodium 135 - 145 mmol/L 141  141  138   Potassium 3.5 - 5.1 mmol/L 3.5  3.8  3.2   Chloride 98 - 111 mmol/L 103  104  107   CO2 22 - 32 mmol/L 25  26  25    Calcium 8.9 - 10.3 mg/dL 9.8  9.8  8.4   Total Protein 6.5 - 8.1 g/dL 6.5  6.7    Total  Bilirubin 0.0 - 1.2 mg/dL 0.4  0.4    Alkaline Phos 38 - 126 U/L 47  46    AST 15 - 41 U/L 18  13    ALT 0 - 44 U/L 16  16       Latest Reference Range & Units 08/26/23 11:52 08/26/23 11:53 09/09/23 09:57  Iron 45 - 182 ug/dL 629    UIBC ug/dL 528    TIBC 413 - 244 ug/dL 010    Saturation Ratios 17.9 - 39.5 % 30    Ferritin 24 - 336 ng/mL  35   Folate >5.9 ng/mL 33.0    Transferrin Receptor 12.2 - 27.3 nmol/L   17.0  Copper 69 - 132 ug/dL  90   Vitamin U72 536 - 914 pg/mL  1,996 (H)   (H): Data is abnormally high  Latest Reference Range & Units 09/09/23 09:57  Total Protein ELP 6.0 - 8.5 g/dL 6.3  Albumin ELP 2.9 - 4.4 g/dL 3.5  Globulin, Total 2.2 - 3.9 g/dL 2.8 (C)  A/G Ratio 0.7 - 1.7  1.3 (C)  Alpha-1-Globulin 0.0 - 0.4 g/dL 0.3  UYQIH-4-VQQVZDGL 0.4 - 1.0 g/dL 0.8  Beta Globulin 0.7 - 1.3 g/dL 1.0  Gamma Globulin 0.4 - 1.8 g/dL 0.7  M-SPIKE, % Not Observed g/dL Not Observed  (C): Corrected  ASSESSMENT & PLAN:  Assessment/Plan:  A 68 y.o. male with mild anemia.  Once again, all recent labs checked showed no evidence of any nutritional deficiency.  Furthermore, his serum protein electrophoresis did not reveal a monoclonal spike to suggest an underlying plasma cell dyscrasia was present.  Recent labs show him to have renal insufficiency, which likely is the major reason behind his anemia.  However, his labs also showed him to be pancytopenic.  Renal insufficiency would not be affecting his white cells or platelets.  Although low, none of his peripheral counts is dangerously low.  For now, his pancytopenia will be followed conservatively.  I will see him back in 4 months for repeat clinical assessment.  If his peripheral counts do fall precipitously over time, the patient understands  a bone marrow biopsy would be done to rule out any type of intrinsic marrow pathology behind his pancytopenia.  The patient understands all the plans discussed today and is in agreement with them.     Hong Timm Kirby Funk, MD

## 2023-09-23 ENCOUNTER — Inpatient Hospital Stay: Payer: Medicare Other

## 2023-09-23 ENCOUNTER — Telehealth: Payer: Self-pay | Admitting: Oncology

## 2023-09-23 ENCOUNTER — Other Ambulatory Visit: Payer: Self-pay | Admitting: Oncology

## 2023-09-23 ENCOUNTER — Inpatient Hospital Stay: Payer: Medicare Other | Admitting: Oncology

## 2023-09-23 VITALS — BP 151/69 | HR 72 | Temp 98.7°F | Resp 16 | Ht 72.05 in | Wt 219.8 lb

## 2023-09-23 DIAGNOSIS — C61 Malignant neoplasm of prostate: Secondary | ICD-10-CM

## 2023-09-23 DIAGNOSIS — D649 Anemia, unspecified: Secondary | ICD-10-CM

## 2023-09-23 DIAGNOSIS — D61818 Other pancytopenia: Secondary | ICD-10-CM | POA: Diagnosis not present

## 2023-09-23 LAB — CBC WITH DIFFERENTIAL (CANCER CENTER ONLY)
Abs Immature Granulocytes: 0.01 10*3/uL (ref 0.00–0.07)
Basophils Absolute: 0.1 10*3/uL (ref 0.0–0.1)
Basophils Relative: 1 %
Eosinophils Absolute: 0.2 10*3/uL (ref 0.0–0.5)
Eosinophils Relative: 6 %
HCT: 33.3 % — ABNORMAL LOW (ref 39.0–52.0)
Hemoglobin: 11.3 g/dL — ABNORMAL LOW (ref 13.0–17.0)
Immature Granulocytes: 0 %
Lymphocytes Relative: 24 %
Lymphs Abs: 0.9 10*3/uL (ref 0.7–4.0)
MCH: 31.1 pg (ref 26.0–34.0)
MCHC: 33.9 g/dL (ref 30.0–36.0)
MCV: 91.7 fL (ref 80.0–100.0)
Monocytes Absolute: 0.4 10*3/uL (ref 0.1–1.0)
Monocytes Relative: 10 %
Neutro Abs: 2.1 10*3/uL (ref 1.7–7.7)
Neutrophils Relative %: 59 %
Platelet Count: 124 10*3/uL — ABNORMAL LOW (ref 150–400)
RBC: 3.63 MIL/uL — ABNORMAL LOW (ref 4.22–5.81)
RDW: 14.9 % (ref 11.5–15.5)
WBC Count: 3.7 10*3/uL — ABNORMAL LOW (ref 4.0–10.5)
nRBC: 0 % (ref 0.0–0.2)
nRBC: 0 /100{WBCs}

## 2023-09-23 LAB — T4, FREE: Free T4: 0.92 ng/dL (ref 0.61–1.12)

## 2023-09-23 NOTE — Telephone Encounter (Signed)
Patient has been scheduled for follow-up visit per 09/23/23 LOS.  Pt given an appt calendar with date and time.

## 2023-09-24 LAB — PSA, TOTAL AND FREE
PSA, Free Pct: UNDETERMINED %
PSA, Free: <0.02 ng/mL
Prostate Specific Ag, Serum: <0.1 ng/mL (ref 0.0–4.0)

## 2024-01-22 NOTE — Progress Notes (Unsigned)
 [facility]  [address] [place],  [place]  [address] [phone]  Clinic Day:  01/23/2024  Referring physician: Erick Greig LABOR, NP   HISTORY OF PRESENT ILLNESS:  The patient is a 68 y.o. male with anemia/borderline pancytopenia of known etiology.  He comes in today to reassess his peripheral counts.  He comes in today for routine follow-up.  Since his last visit, the patient has been doing fairly well.  He denies having any overt forms of blood loss or significant fatigue which concerns him for a decline in his peripheral counts.  Of note, he has a history of stage I (pT2 pN0 M0), Gleason 7 (3+4) prostate cancer treated with robotic radical prostatectomy in May 2023.  He claims to have had mild anemia prior to this surgery.  PHYSICAL EXAM:  Blood pressure 139/67, pulse 66, temperature 98.1 F (36.7 C), temperature source Oral, resp. rate 16, height 6' 0.05 (1.83 m), weight 223 lb 9.6 oz (101.4 kg), SpO2 98%. Wt Readings from Last 3 Encounters:  01/23/24 223 lb 9.6 oz (101.4 kg)  09/23/23 219 lb 12.8 oz (99.7 kg)  09/09/23 219 lb 4.8 oz (99.5 kg)   Body mass index is 30.28 kg/m.  Performance status (ECOG): 1 - Symptomatic but completely ambulatory  Physical Exam Vitals and nursing note reviewed.  Constitutional:      General: He is not in acute distress.    Appearance: Normal appearance. He is normal weight.  HENT:     Head: Normocephalic and atraumatic.     Mouth/Throat:     Mouth: Mucous membranes are moist.     Pharynx: Oropharynx is clear. No oropharyngeal exudate or posterior oropharyngeal erythema.   Eyes:     General: No scleral icterus.    Extraocular Movements: Extraocular movements intact.     Conjunctiva/sclera: Conjunctivae normal.     Pupils: Pupils are equal, round, and reactive to light.    Cardiovascular:     Rate and Rhythm: Normal rate and regular rhythm.     Heart sounds: Normal heart sounds. No murmur heard.    No friction rub.  No gallop.  Pulmonary:     Effort: Pulmonary effort is normal.     Breath sounds: Normal breath sounds. No wheezing, rhonchi or rales.  Abdominal:     General: Bowel sounds are normal. There is no distension.     Palpations: Abdomen is soft. There is no hepatomegaly, splenomegaly or mass.     Tenderness: There is no abdominal tenderness.   Musculoskeletal:        General: Normal range of motion.     Cervical back: Normal range of motion and neck supple. No tenderness.     Right lower leg: No edema.     Left lower leg: No edema.  Lymphadenopathy:     Cervical: No cervical adenopathy.     Upper Body:     Right upper body: No supraclavicular or axillary adenopathy.     Left upper body: No supraclavicular or axillary adenopathy.     Lower Body: No right inguinal adenopathy. No left inguinal adenopathy.   Skin:    General: Skin is warm and dry.     Coloration: Skin is not jaundiced.     Findings: No rash.   Neurological:     Mental Status: He is alert and oriented to person, place, and time.     Cranial Nerves: No cranial nerve deficit.   Psychiatric:  Mood and Affect: Mood normal.        Behavior: Behavior normal.        Thought Content: Thought content normal.     LABS:      Latest Ref Rng & Units 01/23/2024    9:57 AM 09/23/2023   10:22 AM 09/09/2023    9:57 AM  CBC  WBC 4.0 - 10.5 K/uL 4.0  3.7  3.4   Hemoglobin 13.0 - 17.0 g/dL 88.8  88.6  87.8   Hematocrit 39.0 - 52.0 % 34.1  33.3  36.0   Platelets 150 - 400 K/uL 115  124  126       Latest Ref Rng & Units 01/23/2024    9:57 AM 08/26/2023   11:53 AM 05/18/2023    3:56 PM  CMP  Glucose 70 - 99 mg/dL 814  819  816   BUN 8 - 23 mg/dL 22  24  26    Creatinine 0.61 - 1.24 mg/dL 8.56  8.45  8.29   Sodium 135 - 145 mmol/L 139  141  141   Potassium 3.5 - 5.1 mmol/L 3.6  3.5  3.8   Chloride 98 - 111 mmol/L 104  103  104   CO2 22 - 32 mmol/L 23  25  26    Calcium  8.9 - 10.3 mg/dL 9.4  9.8  9.8   Total Protein 6.5 -  8.1 g/dL 6.0  6.5  6.7   Total Bilirubin 0.0 - 1.2 mg/dL 0.3  0.4  0.4   Alkaline Phos 38 - 126 U/L 56  47  46   AST 15 - 41 U/L 14  18  13    ALT 0 - 44 U/L 13  16  16       Latest Reference Range & Units 08/26/23 11:52 08/26/23 11:53 09/09/23 09:57  Iron  45 - 182 ug/dL 876    UIBC ug/dL 708    TIBC 749 - 549 ug/dL 585    Saturation Ratios 17.9 - 39.5 % 30    Ferritin 24 - 336 ng/mL  35   Folate >5.9 ng/mL 33.0    Transferrin Receptor 12.2 - 27.3 nmol/L   17.0  Copper  69 - 132 ug/dL  90   Vitamin B12 819 - 914 pg/mL  1,996 (H)   (H): Data is abnormally high  Latest Reference Range & Units 09/09/23 09:57  Total Protein ELP 6.0 - 8.5 g/dL 6.3  Albumin ELP 2.9 - 4.4 g/dL 3.5  Globulin, Total 2.2 - 3.9 g/dL 2.8 (C)  A/G Ratio 0.7 - 1.7  1.3 (C)  Alpha-1-Globulin 0.0 - 0.4 g/dL 0.3  Joeyj-7-Honalopw 0.4 - 1.0 g/dL 0.8  Beta Globulin 0.7 - 1.3 g/dL 1.0  Gamma Globulin 0.4 - 1.8 g/dL 0.7  M-SPIKE, % Not Observed g/dL Not Observed  (C): Corrected  ASSESSMENT & PLAN:  Assessment/Plan:  A 68 y.o. male with mild anemia/borderline pancytopenia.  His peripheral counts today are not much different than what they have been in the past.  Labs today continue to show a mild degree of renal insufficiency, which could be factoring into his anemia.  However, renal insufficiency typically does not affect white cell or platelet counts.  Although low, none of his peripheral counts is dangerously low.  For now, his borderline pancytopenia will continue to be followed conservatively.  As he is clinically doing well, I will see him back in 6 months for repeat clinical assessment.  The patient understands all the plans discussed today and is  in agreement with them.    Maksym Pfiffner DELENA Kerns, MD

## 2024-01-23 ENCOUNTER — Inpatient Hospital Stay: Payer: Medicare Other | Attending: Oncology

## 2024-01-23 ENCOUNTER — Other Ambulatory Visit: Payer: Self-pay

## 2024-01-23 ENCOUNTER — Other Ambulatory Visit: Payer: Self-pay | Admitting: Oncology

## 2024-01-23 ENCOUNTER — Telehealth: Payer: Self-pay | Admitting: Oncology

## 2024-01-23 ENCOUNTER — Inpatient Hospital Stay (HOSPITAL_BASED_OUTPATIENT_CLINIC_OR_DEPARTMENT_OTHER): Payer: Medicare Other | Admitting: Oncology

## 2024-01-23 VITALS — BP 139/67 | HR 66 | Temp 98.1°F | Resp 16 | Ht 72.05 in | Wt 223.6 lb

## 2024-01-23 DIAGNOSIS — Z9079 Acquired absence of other genital organ(s): Secondary | ICD-10-CM | POA: Insufficient documentation

## 2024-01-23 DIAGNOSIS — D61818 Other pancytopenia: Secondary | ICD-10-CM

## 2024-01-23 DIAGNOSIS — D649 Anemia, unspecified: Secondary | ICD-10-CM | POA: Insufficient documentation

## 2024-01-23 DIAGNOSIS — Z8546 Personal history of malignant neoplasm of prostate: Secondary | ICD-10-CM | POA: Diagnosis not present

## 2024-01-23 DIAGNOSIS — N289 Disorder of kidney and ureter, unspecified: Secondary | ICD-10-CM | POA: Diagnosis not present

## 2024-01-23 LAB — CBC WITH DIFFERENTIAL (CANCER CENTER ONLY)
Abs Immature Granulocytes: 0.02 10*3/uL (ref 0.00–0.07)
Basophils Absolute: 0 10*3/uL (ref 0.0–0.1)
Basophils Relative: 1 %
Eosinophils Absolute: 0.3 10*3/uL (ref 0.0–0.5)
Eosinophils Relative: 7 %
HCT: 34.1 % — ABNORMAL LOW (ref 39.0–52.0)
Hemoglobin: 11.1 g/dL — ABNORMAL LOW (ref 13.0–17.0)
Immature Granulocytes: 1 %
Lymphocytes Relative: 20 %
Lymphs Abs: 0.8 10*3/uL (ref 0.7–4.0)
MCH: 30 pg (ref 26.0–34.0)
MCHC: 32.6 g/dL (ref 30.0–36.0)
MCV: 92.2 fL (ref 80.0–100.0)
Monocytes Absolute: 0.4 10*3/uL (ref 0.1–1.0)
Monocytes Relative: 10 %
Neutro Abs: 2.5 10*3/uL (ref 1.7–7.7)
Neutrophils Relative %: 61 %
Platelet Count: 115 10*3/uL — ABNORMAL LOW (ref 150–400)
RBC: 3.7 MIL/uL — ABNORMAL LOW (ref 4.22–5.81)
RDW: 15.1 % (ref 11.5–15.5)
WBC Count: 4 10*3/uL (ref 4.0–10.5)
nRBC: 0 % (ref 0.0–0.2)

## 2024-01-23 LAB — CMP (CANCER CENTER ONLY)
ALT: 13 U/L (ref 0–44)
AST: 14 U/L — ABNORMAL LOW (ref 15–41)
Albumin: 3.9 g/dL (ref 3.5–5.0)
Alkaline Phosphatase: 56 U/L (ref 38–126)
Anion gap: 12 (ref 5–15)
BUN: 22 mg/dL (ref 8–23)
CO2: 23 mmol/L (ref 22–32)
Calcium: 9.4 mg/dL (ref 8.9–10.3)
Chloride: 104 mmol/L (ref 98–111)
Creatinine: 1.43 mg/dL — ABNORMAL HIGH (ref 0.61–1.24)
GFR, Estimated: 53 mL/min — ABNORMAL LOW (ref >60)
Glucose, Bld: 185 mg/dL — ABNORMAL HIGH (ref 70–99)
Potassium: 3.6 mmol/L (ref 3.5–5.1)
Sodium: 139 mmol/L (ref 135–145)
Total Bilirubin: 0.3 mg/dL (ref 0.0–1.2)
Total Protein: 6 g/dL — ABNORMAL LOW (ref 6.5–8.1)

## 2024-01-23 NOTE — Telephone Encounter (Signed)
 Patient has been scheduled for follow-up visit per 01/23/24 LOS.  Pt given an appt calendar with date and time.

## 2024-07-24 ENCOUNTER — Telehealth: Payer: Self-pay | Admitting: Hematology and Oncology

## 2024-07-24 ENCOUNTER — Inpatient Hospital Stay: Attending: Oncology | Admitting: Hematology and Oncology

## 2024-07-24 ENCOUNTER — Other Ambulatory Visit: Payer: Self-pay

## 2024-07-24 ENCOUNTER — Encounter: Payer: Self-pay | Admitting: Hematology and Oncology

## 2024-07-24 ENCOUNTER — Inpatient Hospital Stay

## 2024-07-24 VITALS — BP 142/62 | HR 67 | Temp 98.0°F | Resp 20 | Ht 72.05 in | Wt 221.9 lb

## 2024-07-24 DIAGNOSIS — D61818 Other pancytopenia: Secondary | ICD-10-CM

## 2024-07-24 DIAGNOSIS — D649 Anemia, unspecified: Secondary | ICD-10-CM | POA: Diagnosis present

## 2024-07-24 LAB — CBC WITH DIFFERENTIAL (CANCER CENTER ONLY)
Abs Immature Granulocytes: 0.01 K/uL (ref 0.00–0.07)
Basophils Absolute: 0 K/uL (ref 0.0–0.1)
Basophils Relative: 1 %
Eosinophils Absolute: 0.3 K/uL (ref 0.0–0.5)
Eosinophils Relative: 6 %
HCT: 33.1 % — ABNORMAL LOW (ref 39.0–52.0)
Hemoglobin: 10.9 g/dL — ABNORMAL LOW (ref 13.0–17.0)
Immature Granulocytes: 0 %
Lymphocytes Relative: 18 %
Lymphs Abs: 0.7 K/uL (ref 0.7–4.0)
MCH: 31 pg (ref 26.0–34.0)
MCHC: 32.9 g/dL (ref 30.0–36.0)
MCV: 94 fL (ref 80.0–100.0)
Monocytes Absolute: 0.4 K/uL (ref 0.1–1.0)
Monocytes Relative: 9 %
Neutro Abs: 2.7 K/uL (ref 1.7–7.7)
Neutrophils Relative %: 66 %
Platelet Count: 116 K/uL — ABNORMAL LOW (ref 150–400)
RBC: 3.52 MIL/uL — ABNORMAL LOW (ref 4.22–5.81)
RDW: 14.7 % (ref 11.5–15.5)
WBC Count: 4.1 K/uL (ref 4.0–10.5)
nRBC: 0 % (ref 0.0–0.2)

## 2024-07-24 LAB — CMP (CANCER CENTER ONLY)
ALT: 15 U/L (ref 0–44)
AST: 19 U/L (ref 15–41)
Albumin: 3.9 g/dL (ref 3.5–5.0)
Alkaline Phosphatase: 50 U/L (ref 38–126)
Anion gap: 11 (ref 5–15)
BUN: 26 mg/dL — ABNORMAL HIGH (ref 8–23)
CO2: 24 mmol/L (ref 22–32)
Calcium: 9.3 mg/dL (ref 8.9–10.3)
Chloride: 106 mmol/L (ref 98–111)
Creatinine: 1.61 mg/dL — ABNORMAL HIGH (ref 0.61–1.24)
GFR, Estimated: 46 mL/min — ABNORMAL LOW
Glucose, Bld: 163 mg/dL — ABNORMAL HIGH (ref 70–99)
Potassium: 3.8 mmol/L (ref 3.5–5.1)
Sodium: 140 mmol/L (ref 135–145)
Total Bilirubin: 0.3 mg/dL (ref 0.0–1.2)
Total Protein: 5.9 g/dL — ABNORMAL LOW (ref 6.5–8.1)

## 2024-07-24 NOTE — Progress Notes (Cosign Needed)
 " [facility]  [address] [place],  [place]  [address] [phone]  Clinic Day:  07/24/2024  Referring physician: Erick Greig LABOR, NP   HISTORY OF PRESENT ILLNESS:  The patient is a 68 y.o. male with anemia/borderline pancytopenia of known etiology.  He comes in today to reassess his peripheral counts.  Since his last visit, the patient has been doing fairly well.  He denies progressive fatigue concerning for worsening anemia.  He states his activity is limited by lower back pain with standing or walking.  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  He denies any overt form of blood loss.   Of note, he has a history of stage I (pT2 pN0 M0), Gleason 7 (3+4) prostate cancer treated with robotic radical prostatectomy in May 2023.  He claims to have had mild anemia prior to this surgery.  He continues to follow with Dr. Alvaro in urology every 6 months and he states his last PSA was 0.02.  VITALS:   Blood pressure (!) 142/62, pulse 67, temperature 98 F (36.7 C), temperature source Oral, resp. rate 20, height 6' 0.05 (1.83 m), weight 221 lb 14.4 oz (100.7 kg), SpO2 100%. Wt Readings from Last 3 Encounters:  07/24/24 221 lb 14.4 oz (100.7 kg)  01/23/24 223 lb 9.6 oz (101.4 kg)  09/23/23 219 lb 12.8 oz (99.7 kg)   Body mass index is 30.05 kg/m.  Performance status (ECOG): 1 - Symptomatic but completely ambulatory  PHYSICAL EXAM:   Physical Exam   LABS:      Latest Ref Rng & Units 07/24/2024    9:47 AM 01/23/2024    9:57 AM 09/23/2023   10:22 AM  CBC  WBC 4.0 - 10.5 K/uL 4.1  4.0  3.7   Hemoglobin 13.0 - 17.0 g/dL 89.0  88.8  88.6   Hematocrit 39.0 - 52.0 % 33.1  34.1  33.3   Platelets 150 - 400 K/uL 116  115  124       Latest Ref Rng & Units 07/24/2024    9:47 AM 01/23/2024    9:57 AM 08/26/2023   11:53 AM  CMP  Glucose 70 - 99 mg/dL 836  814  819   BUN 8 - 23 mg/dL 26  22  24     Creatinine 0.61 - 1.24 mg/dL 8.38  8.56  8.45   Sodium 135 - 145 mmol/L 140  139  141   Potassium 3.5 - 5.1 mmol/L 3.8  3.6  3.5   Chloride 98 - 111 mmol/L 106  104  103   CO2 22 - 32 mmol/L 24  23  25    Calcium  8.9 - 10.3 mg/dL 9.3  9.4  9.8   Total Protein 6.5 - 8.1 g/dL 5.9  6.0  6.5   Total Bilirubin 0.0 - 1.2 mg/dL 0.3  0.3  0.4   Alkaline Phos 38 - 126 U/L 50  56  47   AST 15 - 41 U/L 19  14  18    ALT 0 - 44 U/L 15  13  16       No results found for: CEA1, CEA / No results found for: CEA1, CEA Lab Results  Component Value Date   PSA1 <0.1 09/23/2023   No results found for: RJW800 No results found for: RJW874  Lab Results  Component Value Date   TOTALPROTELP 6.3 09/09/2023   ALBUMINELP 3.5 09/09/2023  A1GS 0.3 09/09/2023   A2GS 0.8 09/09/2023   BETS 1.0 09/09/2023   GAMS 0.7 09/09/2023   MSPIKE Not Observed 09/09/2023   SPEI Comment 09/09/2023   Lab Results  Component Value Date   TIBC 414 08/26/2023   FERRITIN 35 08/26/2023   IRONPCTSAT 30 08/26/2023   Lab Results  Component Value Date   LDH 186 09/09/2023       Component Value Date/Time   TOTALPROTELP 6.3 09/09/2023 0957   ALBUMINELP 3.5 09/09/2023 0957   A1GS 0.3 09/09/2023 0957   A2GS 0.8 09/09/2023 0957   BETS 1.0 09/09/2023 0957   GAMS 0.7 09/09/2023 0957   MSPIKE Not Observed 09/09/2023 0957   SPEI Comment 09/09/2023 0957   LDH 186 09/09/2023 0957   IGASERUM 154 05/18/2023 1556    Review Flowsheet  More data exists      Latest Ref Rng & Units 08/26/2023 09/09/2023 09/23/2023  Oncology Labs  Ferritin 24 - 336 ng/mL 35  - -  %SAT 17.9 - 39.5 % 30  - -  Total Protein ELP 6.0 - 8.5 g/dL - 6.3  -  Albumin ELP 2.9 - 4.4 g/dL - 3.5  -  Alpha-1 Globulin 0.0 - 0.4 g/dL - 0.3  -  Alpha-2 Globulin 0.4 - 1.0 g/dL - 0.8  -  Beta Globulin 0.7 - 1.3 g/dL - 1.0  -  Gamma Globulin 0.4 - 1.8 g/dL - 0.7  -  M-Spike, % Not Observed g/dL - Not Observed  -  SPE Interp. - - Comment  -  LDH 98 -  192 U/L - 186  -  Prostate Specific Ag, Serum 0.0 - 4.0 ng/mL - - <0.1      STUDIES:   No results found.    ASSESSMENT & PLAN:   Assessment/Plan:  68 y.o. male with anemia and thrombocytopenia with intermittent neutropenia.  His peripheral counts today are not much different than what they have been in the past.  Labs today continue to show a degree of renal insufficiency, which could be factoring into his anemia.  However, renal insufficiency typically does not affect white cell or platelet counts.  Although low, none of his peripheral counts is dangerously low.  For now, his borderline pancytopenia will continue to be followed conservatively.  I explained his lower back pain is more likely due to degenerative disease and suggested physical therapy.  He is not inclined to try physical therapy at this time.  I asked him to discuss this with his PCP, Greig Feeling, NP, when he sees her next month.  As he is clinically doing well, I will see him back in 6 months for repeat clinical assessment.  The patient understands all the plans discussed today and is in agreement with them.  He knows to contact our office if he develops concerns prior to his next appointment.     Andrez DELENA Foy, PA-C   Physician Assistant [facility] [place] [phone]    "

## 2024-07-24 NOTE — Telephone Encounter (Signed)
 Patient has been scheduled for follow-up visit per 07/24/2024 LOS.  Pt given an appt calendar with date and time.

## 2025-01-22 ENCOUNTER — Inpatient Hospital Stay

## 2025-01-22 ENCOUNTER — Inpatient Hospital Stay: Admitting: Oncology
